# Patient Record
Sex: Female | Born: 1975 | Race: White | Hispanic: No | Smoking: Never smoker
Health system: Southern US, Community
[De-identification: ages and names within clinical notes are randomized; demographics above are authoritative.]

## PROBLEM LIST (undated history)

## (undated) DIAGNOSIS — F988 Other specified behavioral and emotional disorders with onset usually occurring in childhood and adolescence: Secondary | ICD-10-CM

## (undated) DIAGNOSIS — N83209 Unspecified ovarian cyst, unspecified side: Secondary | ICD-10-CM

## (undated) HISTORY — DX: Other specified behavioral and emotional disorders with onset usually occurring in childhood and adolescence: F98.8

## (undated) HISTORY — PX: ABDOMINAL HYSTERECTOMY: SHX81

## (undated) HISTORY — DX: Unspecified ovarian cyst, unspecified side: N83.209

## (undated) HISTORY — PX: PELVIC LAPAROSCOPY: SHX162

---

## 2009-05-16 ENCOUNTER — Emergency Department (HOSPITAL_COMMUNITY): Admission: EM | Admit: 2009-05-16 | Discharge: 2009-05-17 | Payer: Self-pay | Admitting: Emergency Medicine

## 2009-05-17 ENCOUNTER — Ambulatory Visit (HOSPITAL_COMMUNITY): Admission: RE | Admit: 2009-05-17 | Discharge: 2009-05-17 | Payer: Self-pay | Admitting: Emergency Medicine

## 2009-06-05 ENCOUNTER — Other Ambulatory Visit: Admission: RE | Admit: 2009-06-05 | Discharge: 2009-06-05 | Payer: Self-pay | Admitting: Obstetrics and Gynecology

## 2011-01-04 LAB — WET PREP, GENITAL: Yeast Wet Prep HPF POC: NONE SEEN

## 2011-01-04 LAB — CBC
HCT: 38.6 % (ref 36.0–46.0)
MCHC: 34.7 g/dL (ref 30.0–36.0)
Platelets: 303 10*3/uL (ref 150–400)
RDW: 13.6 % (ref 11.5–15.5)

## 2011-01-04 LAB — DIFFERENTIAL
Basophils Absolute: 0.1 10*3/uL (ref 0.0–0.1)
Basophils Relative: 1 % (ref 0–1)
Eosinophils Relative: 4 % (ref 0–5)
Lymphocytes Relative: 35 % (ref 12–46)
Monocytes Absolute: 0.7 10*3/uL (ref 0.1–1.0)
Neutro Abs: 6.1 10*3/uL (ref 1.7–7.7)

## 2011-01-04 LAB — BASIC METABOLIC PANEL
BUN: 11 mg/dL (ref 6–23)
CO2: 27 mEq/L (ref 19–32)
Calcium: 9.2 mg/dL (ref 8.4–10.5)
GFR calc non Af Amer: >60 mL/min (ref >60)
Glucose, Bld: 102 mg/dL — ABNORMAL HIGH (ref 70–99)

## 2011-01-04 LAB — URINALYSIS, ROUTINE W REFLEX MICROSCOPIC
Bilirubin Urine: NEGATIVE
Hgb urine dipstick: NEGATIVE
Ketones, ur: NEGATIVE mg/dL
Nitrite: NEGATIVE
Urobilinogen, UA: 0.2 mg/dL (ref 0.0–1.0)

## 2011-01-04 LAB — PREGNANCY, URINE: Preg Test, Ur: NEGATIVE

## 2011-01-29 ENCOUNTER — Emergency Department (HOSPITAL_COMMUNITY)
Admission: EM | Admit: 2011-01-29 | Discharge: 2011-01-29 | Disposition: A | Discharge status: Home / Self Care | Payer: Self-pay | Attending: Emergency Medicine | Admitting: Emergency Medicine

## 2011-01-29 ENCOUNTER — Emergency Department (HOSPITAL_COMMUNITY): Payer: Self-pay

## 2011-01-29 DIAGNOSIS — R1011 Right upper quadrant pain: Secondary | ICD-10-CM | POA: Insufficient documentation

## 2011-01-29 LAB — HEPATIC FUNCTION PANEL
Albumin: 4.2 g/dL (ref 3.5–5.2)
Alkaline Phosphatase: 34 U/L — ABNORMAL LOW (ref 39–117)
Bilirubin, Direct: 0.1 mg/dL (ref 0.0–0.3)
Total Bilirubin: 0.4 mg/dL (ref 0.3–1.2)

## 2011-01-29 LAB — CBC
HCT: 37.8 % (ref 36.0–46.0)
Hemoglobin: 13 g/dL (ref 12.0–15.0)
MCH: 30 pg (ref 26.0–34.0)
MCHC: 34.4 g/dL (ref 30.0–36.0)
MCV: 87.3 fL (ref 78.0–100.0)
RBC: 4.33 MIL/uL (ref 3.87–5.11)

## 2011-01-29 LAB — BASIC METABOLIC PANEL
BUN: 10 mg/dL (ref 6–23)
CO2: 25 mEq/L (ref 19–32)
Calcium: 9.3 mg/dL (ref 8.4–10.5)
Chloride: 100 mEq/L (ref 96–112)
Creatinine, Ser: 0.76 mg/dL (ref 0.4–1.2)
GFR calc Af Amer: >60 mL/min (ref >60)
Glucose, Bld: 97 mg/dL (ref 70–99)

## 2011-01-29 LAB — DIFFERENTIAL
Basophils Relative: 1 % (ref 0–1)
Lymphs Abs: 3 10*3/uL (ref 0.7–4.0)
Monocytes Absolute: 0.5 10*3/uL (ref 0.1–1.0)
Monocytes Relative: 6 % (ref 3–12)
Neutro Abs: 4.7 10*3/uL (ref 1.7–7.7)
Neutrophils Relative %: 55 % (ref 43–77)

## 2011-01-29 LAB — LIPASE, BLOOD: Lipase: 18 U/L (ref 11–59)

## 2011-01-29 MED ORDER — IOHEXOL 300 MG/ML  SOLN
100.0000 mL | Freq: Once | INTRAMUSCULAR | Status: AC | PRN
  Administered 2011-01-29: 100 mL via INTRAVENOUS

## 2011-01-30 ENCOUNTER — Ambulatory Visit (HOSPITAL_COMMUNITY)
Admit: 2011-01-30 | Discharge: 2011-01-30 | Disposition: A | Discharge status: Home / Self Care | Payer: Self-pay | Source: Ambulatory Visit | Attending: Emergency Medicine | Admitting: Emergency Medicine

## 2011-01-30 DIAGNOSIS — R1011 Right upper quadrant pain: Secondary | ICD-10-CM | POA: Insufficient documentation

## 2011-09-08 ENCOUNTER — Encounter: Payer: Self-pay | Admitting: Emergency Medicine

## 2011-09-08 ENCOUNTER — Emergency Department (INDEPENDENT_AMBULATORY_CARE_PROVIDER_SITE_OTHER): Payer: 59

## 2011-09-08 ENCOUNTER — Emergency Department (HOSPITAL_COMMUNITY)
Admission: EM | Admit: 2011-09-08 | Discharge: 2011-09-08 | Disposition: A | Discharge status: Home / Self Care | Payer: 59 | Source: Home / Self Care | Attending: Emergency Medicine | Admitting: Emergency Medicine

## 2011-09-08 DIAGNOSIS — J069 Acute upper respiratory infection, unspecified: Secondary | ICD-10-CM

## 2011-09-08 DIAGNOSIS — J029 Acute pharyngitis, unspecified: Secondary | ICD-10-CM

## 2011-09-08 DIAGNOSIS — J40 Bronchitis, not specified as acute or chronic: Secondary | ICD-10-CM

## 2011-09-08 MED ORDER — KETOROLAC TROMETHAMINE 60 MG/2ML IM SOLN
60.0000 mg | Freq: Once | INTRAMUSCULAR | Status: AC
  Administered 2011-09-08: 60 mg via INTRAMUSCULAR

## 2011-09-08 MED ORDER — KETOROLAC TROMETHAMINE 60 MG/2ML IM SOLN
INTRAMUSCULAR | Status: AC
  Filled 2011-09-08: qty 2

## 2011-09-08 MED ORDER — TRAMADOL HCL 50 MG PO TABS: 100.0000 mg | ORAL_TABLET | Freq: Three times a day (TID) | ORAL | Status: AC | PRN

## 2011-09-08 MED ORDER — PREDNISONE 10 MG PO TABS: ORAL_TABLET | ORAL | Status: DC

## 2011-09-08 MED ORDER — HYDROCOD POLST-CHLORPHEN POLST 10-8 MG/5ML PO LQCR: 5.0000 mL | Freq: Two times a day (BID) | ORAL | Status: DC | PRN

## 2011-09-08 MED ORDER — AMOXICILLIN-POT CLAVULANATE 875-125 MG PO TABS: 1.0000 | ORAL_TABLET | Freq: Two times a day (BID) | ORAL | Status: AC

## 2011-09-08 NOTE — ED Provider Notes (Signed)
History     CSN: [id number] Arrival date & time: 09/08/2011  6:43 PM   First MD Initiated Contact with Patient 09/08/11 1659      Chief Complaint  Patient presents with  . URI  . Fever    (Consider location/radiation/quality/duration/timing/severity/associated sxs/prior treatment) HPI Comments: Courtney Parsons has had a three-day history of cough productive of small amounts of yellow sputum with blood, wheezing, swollen, tender glands, severe sore throat, temperature 100.5, chills, sweats, earache, headache, and nausea. She has been exposed to a coworker who had a similar illness. She's not been able to eat or drink much and notes a diminished urine output. She has had her flu vaccine this year.  Patient is a 35 y.o. female presenting with URI and fever.  URI The primary symptoms include fever, fatigue, headaches, ear pain, sore throat, cough and nausea. Primary symptoms do not include wheezing, abdominal pain, vomiting or rash.  The headache is not associated with eye pain or neck stiffness.  The sore throat is accompanied by trouble swallowing.  The illness is not associated with chills, congestion or rhinorrhea.  Fever Primary symptoms of the febrile illness include fever, fatigue, headaches, cough and nausea. Primary symptoms do not include wheezing, shortness of breath, abdominal pain, vomiting, diarrhea or rash.  The headache is not associated with eye pain or neck stiffness.    History reviewed. No pertinent past medical history.  Past Surgical History  Procedure Date  . Abdominal hysterectomy     History reviewed. No pertinent family history.  History  Substance Use Topics  . Smoking status: Never Smoker   . Smokeless tobacco: Not on file  . Alcohol Use: No    OB History    Grav Para Term Preterm Abortions TAB SAB Ect Mult Living                  Review of Systems  Constitutional: Positive for fever and fatigue. Negative for chills.  HENT: Positive for ear pain,  sore throat and trouble swallowing. Negative for congestion, rhinorrhea, sneezing, neck stiffness, voice change and postnasal drip.   Eyes: Negative for pain, discharge and redness.  Respiratory: Positive for cough. Negative for chest tightness, shortness of breath and wheezing.   Gastrointestinal: Positive for nausea. Negative for vomiting, abdominal pain and diarrhea.  Skin: Negative for rash.  Neurological: Positive for headaches.    Allergies  Keflex  Home Medications   Current Outpatient Rx  Name Route Sig Dispense Refill  . CEFIXIME 400 MG PO TABS Oral Take 400 mg by mouth daily.      . AMOXICILLIN-POT CLAVULANATE 875-125 MG PO TABS Oral Take 1 tablet by mouth 2 (two) times daily. 20 tablet 0  . HYDROCOD POLST-CHLORPHEN POLST 10-8 MG/5ML PO LQCR Oral Take 5 mLs by mouth every 12 (twelve) hours as needed. 140 mL 0  . PREDNISONE 10 MG PO TABS  Take 4 tabs daily for 4 days, 3 tabs daily for 4 days, 2 tabs daily for 4 days, then 1 tab daily for 4 days.  Take all tabs at one time with food and preferably in the morning except for the first dose. 40 tablet 0  . TRAMADOL HCL 50 MG PO TABS Oral Take 2 tablets (100 mg total) by mouth every 8 (eight) hours as needed for pain. Maximum dose= 8 tablets per day 30 tablet 0    BP 122/74  Pulse 60  Temp(Src) 98.4 F (36.9 C) (Oral)  Resp 16  SpO2 99%  Physical Exam  Nursing note and vitals reviewed. Constitutional: She appears well-developed and well-nourished. No distress.  HENT:  Head: Normocephalic and atraumatic.  Right Ear: External ear normal.  Left Ear: External ear normal.  Nose: Nose normal.  Mouth/Throat: Oropharynx is clear and moist. No oropharyngeal exudate (her oropharynx is only minimally erythematous with no drainage, discharge, or ulcerations.).  Eyes: Conjunctivae and EOM are normal. Pupils are equal, round, and reactive to light. Right eye exhibits no discharge. Left eye exhibits no discharge.  Neck: Normal range of  motion. Neck supple.  Cardiovascular: Normal rate, regular rhythm and normal heart sounds.   Pulmonary/Chest: Effort normal and breath sounds normal. No stridor. No respiratory distress. She has no wheezes. She has no rales. She exhibits no tenderness.  Lymphadenopathy:    She has no cervical adenopathy.  Skin: Skin is warm and dry. No rash noted. She is not diaphoretic.    ED Course  Procedures (including critical care time)  Labs Reviewed - No data to display Dg Chest 2 View  09/08/2011  *RADIOLOGY REPORT*  Clinical Data: Cough and fever.  CHEST - 2 VIEW  Comparison: None.  Findings: No infiltrate, congestive heart failure or pneumothorax.  Heart size within normal limits.  Mild scoliosis.  IMPRESSION: No infiltrate.  Original Report Authenticated By: Fuller Canada, M.D.     1. Viral upper respiratory illness   2. Pharyngitis   3. Bronchitis       MDM  She has a severe sore throat, but nothing to suggest strep. She has been taking Suprax for 3 days, so a throat culture rapid strep would be useless at this point. The fact has not responded to antibiotics suggests a viral etiology, nevertheless she does appear to have bronchitis as well so we'll treat that with Augmentin and give her something for pain and a prednisone taper.        Roque Lias, MD 09/08/11 [phone]

## 2011-09-08 NOTE — ED Notes (Signed)
Pt here with sx fever x 3dys relieved by tylenol then elevates.last temp last night 100.5.sx dry cough,sore throat,diff swallowing and sore neck and wheezing.pt used advair 250/50 at home and suprax that she had at home and tylenol but no relief.afebrile on admit.

## 2012-10-22 ENCOUNTER — Encounter (HOSPITAL_COMMUNITY): Payer: Self-pay | Admitting: *Deleted

## 2012-10-22 ENCOUNTER — Emergency Department (HOSPITAL_COMMUNITY): Payer: 59

## 2012-10-22 ENCOUNTER — Emergency Department (HOSPITAL_COMMUNITY)
Admission: EM | Admit: 2012-10-22 | Discharge: 2012-10-22 | Disposition: A | Discharge status: Home / Self Care | Payer: 59 | Attending: Emergency Medicine | Admitting: Emergency Medicine

## 2012-10-22 DIAGNOSIS — R1110 Vomiting, unspecified: Secondary | ICD-10-CM

## 2012-10-22 DIAGNOSIS — Z90710 Acquired absence of both cervix and uterus: Secondary | ICD-10-CM | POA: Insufficient documentation

## 2012-10-22 DIAGNOSIS — R109 Unspecified abdominal pain: Secondary | ICD-10-CM | POA: Insufficient documentation

## 2012-10-22 DIAGNOSIS — R112 Nausea with vomiting, unspecified: Secondary | ICD-10-CM | POA: Insufficient documentation

## 2012-10-22 LAB — LIPASE, BLOOD: Lipase: 15 U/L (ref 11–59)

## 2012-10-22 LAB — COMPREHENSIVE METABOLIC PANEL
ALT: 13 U/L (ref 0–35)
Albumin: 3.5 g/dL (ref 3.5–5.2)
Alkaline Phosphatase: 40 U/L (ref 39–117)
BUN: 7 mg/dL (ref 6–23)
Chloride: 104 mEq/L (ref 96–112)
GFR calc Af Amer: >90 mL/min (ref >90)
Glucose, Bld: 88 mg/dL (ref 70–99)
Potassium: 4 mEq/L (ref 3.5–5.1)
Sodium: 139 mEq/L (ref 135–145)
Total Bilirubin: 0.1 mg/dL — ABNORMAL LOW (ref 0.3–1.2)

## 2012-10-22 MED ORDER — ONDANSETRON HCL 4 MG/2ML IJ SOLN
4.0000 mg | Freq: Once | INTRAMUSCULAR | Status: AC
  Administered 2012-10-22: 4 mg via INTRAVENOUS
  Filled 2012-10-22: qty 2

## 2012-10-22 MED ORDER — OXYCODONE-ACETAMINOPHEN 5-325 MG PO TABS: 1.0000 | ORAL_TABLET | Freq: Four times a day (QID) | ORAL | Status: DC | PRN

## 2012-10-22 MED ORDER — FENTANYL CITRATE 0.05 MG/ML IJ SOLN
50.0000 ug | Freq: Once | INTRAMUSCULAR | Status: AC
  Administered 2012-10-22: 50 ug via INTRAVENOUS
  Filled 2012-10-22: qty 2

## 2012-10-22 MED ORDER — SODIUM CHLORIDE 0.9 % IV BOLUS (SEPSIS)
1000.0000 mL | Freq: Once | INTRAVENOUS | Status: AC
  Administered 2012-10-22: 1000 mL via INTRAVENOUS

## 2012-10-22 MED ORDER — ONDANSETRON HCL 4 MG PO TABS: 4.0000 mg | ORAL_TABLET | Freq: Three times a day (TID) | ORAL | Status: DC | PRN

## 2012-10-22 NOTE — ED Provider Notes (Signed)
History     CSN: [id number]  Arrival date & time 10/22/12  1907   First MD Initiated Contact with Patient 10/22/12 1954      Chief Complaint  Patient presents with  . Emesis    (Consider location/radiation/quality/duration/timing/severity/associated sxs/prior treatment) HPI Pt reports she has had persistent vomiting and abdominal bloating for the last 4-5 days. No BM during that time except small amount today. She has vomited everything she's tried to eat or drink including Sprite just prior to arrival. She reports intermittent diffuse cramping abdominal pain, occasionally worse in the RUQ. She was seen at PCP office today in [place] where she had normal UA and CBC. Sent to the ED for further eval.    History reviewed. No pertinent past medical history.  Past Surgical History  Procedure Date  . Abdominal hysterectomy   . Cesarean section     History reviewed. No pertinent family history.  History  Substance Use Topics  . Smoking status: Never Smoker   . Smokeless tobacco: Not on file  . Alcohol Use: Yes     Comment: rarely    OB History    Grav Para Term Preterm Abortions TAB SAB Ect Mult Living                  Review of Systems All other systems reviewed and are negative except as noted in HPI.   Allergies  Dilaudid; Epinephrine; and Cephalexin  Home Medications   Current Outpatient Rx  Name  Route  Sig  Dispense  Refill  . ACETAMINOPHEN 500 MG PO TABS   Oral   Take 500 mg by mouth every 6 (six) hours as needed. For pain         . IBUPROFEN 200 MG PO TABS   Oral   Take 200 mg by mouth every 6 (six) hours as needed. For pain           BP 111/74  Pulse 70  Temp 97.9 F (36.6 C) (Oral)  Resp 20  Ht 5\' 2"  (1.575 m)  Wt 148 lb (67.132 kg)  BMI 27.07 kg/m2  SpO2 100%  Physical Exam  Nursing note and vitals reviewed. Constitutional: She is oriented to person, place, and time. She appears well-developed and well-nourished.  HENT:  Head:  Normocephalic and atraumatic.  Eyes: EOM are normal. Pupils are equal, round, and reactive to light.  Neck: Normal range of motion. Neck supple.  Cardiovascular: Normal rate, normal heart sounds and intact distal pulses.   Pulmonary/Chest: Effort normal and breath sounds normal.  Abdominal: Bowel sounds are normal. She exhibits no distension. There is tenderness (diffuse, moderate). There is guarding. There is no rebound.  Musculoskeletal: Normal range of motion. She exhibits no edema and no tenderness.  Neurological: She is alert and oriented to person, place, and time. She has normal strength. No cranial nerve deficit or sensory deficit.  Skin: Skin is warm and dry. No rash noted.  Psychiatric: She has a normal mood and affect.    ED Course  Procedures (including critical care time)  Labs Reviewed  COMPREHENSIVE METABOLIC PANEL - Abnormal; Notable for the following:    Total Bilirubin 0.1 (*)     All other components within normal limits  LIPASE, BLOOD   Dg Abd Acute W/chest  10/22/2012  *RADIOLOGY REPORT*  Clinical Data: Abdominal pain, vomiting  ACUTE ABDOMEN SERIES (ABDOMEN 2 VIEW & CHEST 1 VIEW)  Comparison: 09/08/2011  Findings: Cardiomediastinal silhouette is stable.  No acute infiltrate  or pulmonary edema.  There is nonspecific nonobstructive bowel gas pattern.  Stool and gas noted in proximal colon.  Gas noted in the distal colon.  No free abdominal air.  IMPRESSION: No acute disease.  Nonspecific nonobstructive bowel gas pattern. Stool and gas noted in proximal colon.  Gas noted in distal colon without significant colonic distention.   Original Report Authenticated By: Natasha Mead, M.D.      No diagnosis found.    MDM  Labs unremarkable. Pt feeling better. Will attempt PO trial.   9:27 PM Pt able to tolerate PO, but having some cramping pain and nausea. Will have patient return in the AM for [place] to rule out gallstones. Normal labs today, so doubt cholecystitis or obstructing  stone.       Jerry Haugen B. Bernette Mayers, MD 10/22/12 2128

## 2012-10-22 NOTE — ED Notes (Signed)
Nausea, vomiting, feels distended, Last bm  Was Monday.  Sent from [facility]

## 2012-10-23 ENCOUNTER — Ambulatory Visit (HOSPITAL_COMMUNITY)
Admit: 2012-10-23 | Discharge: 2012-10-23 | Disposition: A | Discharge status: Home / Self Care | Payer: 59 | Source: Ambulatory Visit | Attending: Emergency Medicine | Admitting: Emergency Medicine

## 2012-10-23 DIAGNOSIS — R109 Unspecified abdominal pain: Secondary | ICD-10-CM | POA: Insufficient documentation

## 2012-10-23 DIAGNOSIS — R112 Nausea with vomiting, unspecified: Secondary | ICD-10-CM | POA: Insufficient documentation

## 2013-06-21 ENCOUNTER — Ambulatory Visit (INDEPENDENT_AMBULATORY_CARE_PROVIDER_SITE_OTHER): Payer: 59 | Admitting: Psychology

## 2013-06-21 ENCOUNTER — Encounter (HOSPITAL_COMMUNITY): Payer: Self-pay | Admitting: Psychology

## 2013-06-21 DIAGNOSIS — F988 Other specified behavioral and emotional disorders with onset usually occurring in childhood and adolescence: Secondary | ICD-10-CM

## 2013-06-21 NOTE — Progress Notes (Signed)
Patient:   Courtney Parsons   DOB:   January 16, 1976  MR Number:  [id number]  Location:  [facility] [address] [place] [address] Dept: [phone]           Date of Service:   06/21/2013  Start Time:    3 PM End Time:    4 PM  Provider/Observer:  Hershal Coria PSYD       Billing Code/Service: [phone]  Chief Complaint:     Chief Complaint  Patient presents with  . ADD    Reason for Service:   the patient was self referred because of difficulty with concentration and attention. The patient reports he has difficulty maintaining focus and poor memory abilities. The patient reports that these attention and concentration issues have been present for as far as she can remember but they do feel like they're getting worse over time. She reports that these have been a major issue on her job situation in a relationship issues. The patient reports that she is planning on continuing her education and working on her Masters in social work and is concerned that these issues will have a deleterious effect on these attempts.   Current Status:  The patient describes significant problems with attention concentration, racing thoughts, memory problems, and excessive worrying.  Reliability of Information: The information is provided by the patient as well as a review of available medical records.  Behavioral Observation: Courtney Parsons  presents as a 37 y.o.-year-old Right Caucasian Female who appeared her stated age. her dress was Appropriate and she was Well Groomed and her manners were Appropriate to the situation.  There were not any physical disabilities noted.  she displayed an appropriate level of cooperation and motivation.    Interactions:    Active   Attention:   within normal limits  Memory:   within normal limits  Visuo-spatial:   within normal limits  Speech  (Volume):  normal  Speech:   normal pitch and normal volume  Thought Process:  Coherent  Though Content:  WNL  Orientation:   person, place, time/date and situation  Judgment:   Good  Planning:   Good  Affect:    Anxious  Mood:    Anxious  Insight:   Good  Intelligence:   high  Marital Status/Living:  the patient was born and raised in [place] and has a 42 year old brother. Her parents were never married and her father ended up dying from a drug overdose. The patient is divorced and has an 58 year old daughter. The patient reports that she spends her leisure time working out at the gym for 5 days a week. She lives by herself. She does have a boyfriend.   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   Past Employment:    Substance Use:  No concerns of substance abuse are reported.    Education:   College the patient received her GED and return to school to get her bachelors degree in human services. The patient does acknowledge a hard time concentrating and maintaining focus throughout school. The patient is planning on returning to school to get her Masters in social work.    Medical History:  History reviewed. No pertinent past medical history.      Outpatient Encounter Prescriptions as of 06/21/2013  Medication Sig Dispense  Refill  . acetaminophen (TYLENOL) 500 MG tablet Take 500 mg by mouth every 6 (six) hours as needed. For pain      . ibuprofen (ADVIL,MOTRIN) 200 MG tablet Take 200 mg by mouth every 6 (six) hours as needed. For pain      . ondansetron (ZOFRAN) 4 MG tablet Take 1 tablet (4 mg total) by mouth every 8 (eight) hours as needed for nausea.  12 tablet  0  . oxyCODONE-acetaminophen (PERCOCET/ROXICET) 5-325 MG per tablet Take 1-2 tablets by mouth every 6 (six) hours as needed for pain.  20 tablet  0   No facility-administered encounter medications on  file as of 06/21/2013.          Sexual History:   History  Sexual Activity  . Sexual Activity: Yes  . Birth Control/ Protection: Surgical    Abuse/Trauma History:  the patient denies any history of abuse or trauma.  Psychiatric History:   other than the difficulties that she is had with regard to attention and concentration the patient has no other psychiatric history.   Family Med/Psych History: No family history on file.  Risk of Suicide/Violence: virtually non-existent   Impression/DX:   at this point, the patient does describe some consistent and persistent cough with attention/concentration problem for all of her life. 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 There are some indications of some anxiety and the basic differential we are looking at his attention deficit disorder versus underlying anxiety condition.   Disposition/Plan:   we will set the patient up for formal and objective assessment of attention/concentration abilities utilizing the comprehensive attention battery.   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

## 2013-07-07 ENCOUNTER — Encounter (HOSPITAL_COMMUNITY): Payer: Self-pay | Admitting: Psychology

## 2013-07-07 ENCOUNTER — Ambulatory Visit (INDEPENDENT_AMBULATORY_CARE_PROVIDER_SITE_OTHER): Payer: 59 | Admitting: Psychology

## 2013-07-07 DIAGNOSIS — F988 Other specified behavioral and emotional disorders with onset usually occurring in childhood and adolescence: Secondary | ICD-10-CM

## 2013-07-07 DIAGNOSIS — F411 Generalized anxiety disorder: Secondary | ICD-10-CM

## 2013-07-07 NOTE — Progress Notes (Signed)
The patient was administered the Comprehensive Attention Battery and the CAB CPT measures. The patient appeared to fully participate in these testing procedures and this does appear to be a fair and valid sample of her current attentional abilities as well as various aspects of executive functioning. Below are the results of this broad and comprehensive assessment of attention/concentration and executive functioning.  Initially, the patient was administered the auditory/visual reaction time test. These two measures are both pure reaction time measures and are administered in both the visual and auditory modalities. On the visual pure reaction time test, the patient accurately responded to 41 of the 50 targets, which is an impaired score but I do think some of this initial difficulty had more to do with the way her responding style interacting with the touch green interface and not with actual excessive omissions. her average response time was 345 ms which is within normal limits. The patient was administered the auditory pure reaction time test and she correctly responded to 46 of 50 targets, which is an efficient performance and within normal limits. her average response time was 334 ms, which within normal limits.  The patient was then administered the discriminant reaction time test. she was administered the visual, auditory, and mixed subtests. On the visual discriminate reaction time measure, she correctly responded to 31 of 35 targets and had 0 errors of commission and 4 errors of omission. This is an efficient performance and represents a performance that is within normative expectations. her average response time for correctly responded to items was 424 ms which is also within normal limits. The patient was then administered the auditory discriminate reaction time measure. she correctly responded to 34 of 35 targets, which is a good performance and within normal limits. her average response time was 678  ms, which is also within normative expectations. The patient was then administered the mixed discriminate reaction time, which require shifting from between either auditory or visual targets with an alteration between auditory and visual stimuli. This measure require shifting attention on top of discriminate identification and responding.  The patient correctly responded to 28 of the 30 targets and had 2 errors of commission and 2 errors of omission. This is a very good score for accuracy.  her average response time for correct responses was 743 ms.  This performance is within  normal limits and represents excellent performance.  The patient was administered the auditory/visual scan reaction time test. On the visual measure the patient correctly responded to 37 of 40 targets and the average response time was within normal limits. The auditory measure resulted in the correct response to 39 of 40 targets with 1 errors of commission and 1 error of omission. her average response times was within normal limits. The patient was then administered the mixed auditory visual scan measure and she correctly responded to 40 of 40 targets, which is within normal limits and her response times were within normal limits.  The patient was then administered the auditory/visual encoding test. On the auditory forwards the patient's performance was within normal limits.  On the auditory backwards measures the patient's performance was within normal limits.  This pattern suggests that not only did she will normative expectations for her age matched comparison group she has exceeded does and was nearly a standard deviation above for years for both auditory encoding forward and auditory encoding backwards. On the visual encoding forward measure the patient produced performance that was above normal limits.  On the visual backwards  measures the patient's performance was above normal limits.  Overall, this pattern suggests that auditory  encoding as well as visual encoding are all equal to or above normative expectations. She approached 2 standard deviations above normative expectations for visual encoding backwards. Again, the patient did excellent on encoding measures.  The patient was then administered the Stroop interference cancellation test. This task is broken down into eight separate trials. On the first four trials the patient is presented with a focus execute task that requires the patient to scan a 36 grid layout in which the words red green or blue were randomly printed in each grid. Each of these color words and be printed in either red green or blue color. On half of them, the word matches the color of the font and it is these that the patient is to identify where the color and word match. After the first four trials of this visual scanning measure change to four trials that include a Stroop interference component inwhich the words red green and blue are played randomly over the speakers. On the first four "noninterference" trials the patient produced performances on these focus execute task that were  equal to or above normal limits. she correctly identified between  12 and  18 items on each of these trials. On the next four interference trials, the patient's performance showed  continued excellent performance in no indication of any interference effects. The patient had  No significant interference and  no difficulty handling the Stroop interference measures. her performance under these interference measures suggest  excellent focus execute abilities.  The patient was then administered the CAB CPT visual monitor measure, which is a 15 minute long visual continuous performance measure.  This measure is broken down into five 3-minute blocks of time for analysis. The patient is presented with either the color red green or blue every 2 seconds and every time the color red is presented the patient is to respond. On the first 3 min.  Block of time the patient correctly identified  29 of 30 targets with  0 error of commission and  1 errors of omission. her average response time was  383 ms. This performance  consistently and progressively deteriorated over the next four blocks of time.  Average response time  slowed consistent and by the last 3 min. of this measure average response time was  655  ms, which is  a significant increase over the very first 3 min. of this task of approaching 200 ms. The results of this continues performance measure are  clearly consistent with any deficits with regard to sustained attention and concentration.  Overall:  The patient's performance on this broad range of attention/concentration measures and executive functioning measures are are consistent with patterns found with attention deficit disorder and the consistent with attentional deficits associated with anxiety. Across the board almost all of the measures of attention concentration including pure reaction time measures discriminate reaction time measures, cognitive shifting measures, both auditory and visual encoding measures, as well as measures designed to assess information processing speed and the ability to remain free from target distraction the patient did equal to or above normative expectations. There were no deficits down for focus execute abilities, encoding abilities, or shifting abilities regard to attention and concentration and either auditory or visual sensory modalities. However, this didn't start contrast to the final measure objectively assessing for sustained attention. While the patient started out quite efficient performance of the first 3 minutes of this  measure her performance consistently and progressively declined as a function of time. For example on the first 3 minutes she had one error of omission and by the block of time between 9 and 12 minutes she had a errors of omission the pattern showed a progressive and steady  deterioration rather than isolated deterioration suggesting also set or momentary loss of function. The patient showed progressive and steady slowing in her response times with a short episode of recovery towards the very end of the measure where she was likely realizing her loss of focus and reviewed and tried to "pull it together" active his realization.  Patient does have degree of anxiety and worry he does not appear that there is a significant underlying anxiety disorder. Perform his anxiety and concerns about her on self-awareness of these attentional problems are probably the biggest role with regard to her apprehension. The patient shows very clear in specific patterns consistent with individuals diagnosed with adult residual attention deficit disorder and often been shown to be good responders to psychostimulant medications. If it wasn't for some of the mild anxiety symptoms this would clearly be the specific recommendation as far as interventions. While I do think that this is likely to be the most efficacious strategy for improving the patient's performance I will encourage her to keep an eye on any worsening of underlying anxiety or other changes just to make sure we are not missing something with regard to anxiety that could potentially be exacerbated by psychostimulant medications.

## 2013-07-13 ENCOUNTER — Ambulatory Visit (INDEPENDENT_AMBULATORY_CARE_PROVIDER_SITE_OTHER): Payer: 59 | Admitting: Psychology

## 2013-07-13 DIAGNOSIS — F988 Other specified behavioral and emotional disorders with onset usually occurring in childhood and adolescence: Secondary | ICD-10-CM

## 2013-08-04 ENCOUNTER — Other Ambulatory Visit: Payer: Self-pay

## 2013-08-23 ENCOUNTER — Encounter (HOSPITAL_COMMUNITY): Payer: Self-pay | Admitting: Psychology

## 2013-08-23 NOTE — Progress Notes (Signed)
Today I provided feedback regarding the results of the recent neuropsychological testing to assess objective factors and features of attentional functioning. The purpose was to do an evaluation and rule out versus ruling out of attention deficit disorder and other possible explanations of her complaints. Below is a summary of the recent evaluation.  Overall:  The patient's performance on this broad range of attention/concentration measures and executive functioning measures are are consistent with patterns found with attention deficit disorder and the consistent with attentional deficits associated with anxiety. Across the board almost all of the measures of attention concentration including pure reaction time measures discriminate reaction time measures, cognitive shifting measures, both auditory and visual encoding measures, as well as measures designed to assess information processing speed and the ability to remain free from target distraction the patient did equal to or above normative expectations. There were no deficits down for focus execute abilities, encoding abilities, or shifting abilities regard to attention and concentration and either auditory or visual sensory modalities. However, this didn't start contrast to the final measure objectively assessing for sustained attention. While the patient started out quite efficient performance of the first 3 minutes of this measure her performance consistently and progressively declined as a function of time. For example on the first 3 minutes she had one error of omission and by the block of time between 9 and 12 minutes she had a errors of omission the pattern showed a progressive and steady deterioration rather than isolated deterioration suggesting also set or momentary loss of function. The patient showed progressive and steady slowing in her response times with a short episode of recovery towards the very end of the measure where she was likely realizing  her loss of focus and reviewed and tried to "pull it together" active his realization.  Patient does have degree of anxiety and worry he does not appear that there is a significant underlying anxiety disorder. Perform his anxiety and concerns about her on self-awareness of these attentional problems are probably the biggest role with regard to her apprehension. The patient shows very clear in specific patterns consistent with individuals diagnosed with adult residual attention deficit disorder and often been shown to be good responders to psychostimulant medications. If it wasn't for some of the mild anxiety symptoms this would clearly be the specific recommendation as far as interventions. While I do think that this is likely to be the most efficacious strategy for improving the patient's performance I will encourage her to keep an eye on any worsening of underlying anxiety or other changes just to make sure we are not missing something with regard to anxiety that could potentially be exacerbated by psychostimulant medications.

## 2013-10-19 ENCOUNTER — Encounter: Payer: Self-pay | Admitting: Women's Health

## 2013-10-19 ENCOUNTER — Ambulatory Visit (INDEPENDENT_AMBULATORY_CARE_PROVIDER_SITE_OTHER): Payer: 59 | Admitting: Women's Health

## 2013-10-19 VITALS — BP 124/80 | Ht 61.0 in | Wt 140.0 lb

## 2013-10-19 DIAGNOSIS — N949 Unspecified condition associated with female genital organs and menstrual cycle: Secondary | ICD-10-CM

## 2013-10-19 DIAGNOSIS — N898 Other specified noninflammatory disorders of vagina: Secondary | ICD-10-CM

## 2013-10-19 DIAGNOSIS — Z01419 Encounter for gynecological examination (general) (routine) without abnormal findings: Secondary | ICD-10-CM

## 2013-10-19 DIAGNOSIS — Z833 Family history of diabetes mellitus: Secondary | ICD-10-CM

## 2013-10-19 LAB — WET PREP FOR TRICH, YEAST, CLUE
CLUE CELLS WET PREP: NONE SEEN
TRICH WET PREP: NONE SEEN
WBC, Wet Prep HPF POC: NONE SEEN
YEAST WET PREP: NONE SEEN

## 2013-10-19 LAB — CBC WITH DIFFERENTIAL/PLATELET
BASOS ABS: 0 10*3/uL (ref 0.0–0.1)
Basophils Relative: 0 % (ref 0–1)
EOS ABS: 0.1 10*3/uL (ref 0.0–0.7)
EOS PCT: 1 % (ref 0–5)
HCT: 39.6 % (ref 36.0–46.0)
Hemoglobin: 13.2 g/dL (ref 12.0–15.0)
LYMPHS PCT: 36 % (ref 12–46)
Lymphs Abs: 2.7 10*3/uL (ref 0.7–4.0)
MCH: 29.7 pg (ref 26.0–34.0)
MCHC: 33.3 g/dL (ref 30.0–36.0)
MCV: 89 fL (ref 78.0–100.0)
Monocytes Absolute: 0.4 10*3/uL (ref 0.1–1.0)
Monocytes Relative: 6 % (ref 3–12)
Neutro Abs: 4.2 10*3/uL (ref 1.7–7.7)
Neutrophils Relative %: 57 % (ref 43–77)
PLATELETS: 310 10*3/uL (ref 150–400)
RBC: 4.45 MIL/uL (ref 3.87–5.11)
RDW: 13.4 % (ref 11.5–15.5)
WBC: 7.4 10*3/uL (ref 4.0–10.5)

## 2013-10-19 NOTE — Patient Instructions (Signed)

## 2013-10-19 NOTE — Progress Notes (Signed)
Henderson Cloudammy L Cappelletti [birth year] [id number]    History:    Presents for annual exam with complaint of chronic abdominal pain. TAH 2011 for abdominal pain without relief, [facility].  Same partner past three years/ reports dyspareunia with deep penetration. Normal pap history. Reports frequent yeast infections and vaginal odor since hysterectomy. History of ovarian cysts. 80 pound weight loss with [employer]  last year.  ADD, managed by primary care.   Past medical history, past surgical history, family history and social history were all reviewed and documented in the EPIC chart. 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  Katelyn, 18, nursing school.  Mother DM, HTN, heart disease, Father history unknown  ROS:  A  ROS was performed and pertinent positives and negatives are included.  Exam:  Filed Vitals:   10/19/13 1607  BP: 124/80    General appearance:  Normal Thyroid:  Symmetrical, normal in size, without palpable masses or nodularity. Respiratory  Auscultation:  Clear without wheezing or rhonchi Cardiovascular  Auscultation:  Regular rate, without rubs, murmurs or gallops  Edema/varicosities:  Not grossly evident Abdominal  Tender with some guarding below umbilical/, without masses, or rebound.  Liver/spleen:  No organomegaly noted  Hernia:  None appreciated  Skin  Inspection:  Grossly normal   Breasts: Examined lying and sitting.     Right: Without masses, retractions, discharge or axillary adenopathy.     Left: Without masses, retractions, discharge or axillary adenopathy. Gentitourinary   Inguinal/mons:  Normal without inguinal adenopathy  External genitalia:  Normal  BUS/Urethra/Skene's glands:  Normal  Vagina:  Normal, wet prep negative  Adnexa/parametria:     Rt: Without masses or tenderness.   Lt: Without masses or tenderness.  Anus and perineum: Normal  Digital rectal exam: Normal sphincter tone without palpated masses or  tenderness  Assessment/Plan:  38 y.o.  DWF G1P1 for annual exam with complaint of chronic abdominal pain.  TAH for chronic abdominal pain without relief  Chronic abdominal pain ADD, managed by primary care   Plan: CBC, glucose, UA.  SBE's, encouraged to schedule mammogram next year.  Continue exercise, healthy lifestyle (80 pound weight loss with [employer]), MVI encouraged.  Instructed to schedule GI consult for persistent abdominal pain. Reassured wet prep negative no visible infection. Encourage vaginal lubricants with intercourse.    Harrington ChallengerYOUNG,NANCY J [facility] CenterWHNP, 4:58 PM 10/19/2013

## 2013-10-20 LAB — URINALYSIS W MICROSCOPIC + REFLEX CULTURE
BACTERIA UA: NONE SEEN
BILIRUBIN URINE: NEGATIVE
Casts: NONE SEEN
Crystals: NONE SEEN
Glucose, UA: NEGATIVE mg/dL
Hgb urine dipstick: NEGATIVE
KETONES UR: NEGATIVE mg/dL
Leukocytes, UA: NEGATIVE
Nitrite: NEGATIVE
PROTEIN: NEGATIVE mg/dL
Specific Gravity, Urine: 1.008 (ref 1.005–1.030)
Squamous Epithelial / LPF: NONE SEEN
UROBILINOGEN UA: 0.2 mg/dL (ref 0.0–1.0)
pH: 6 (ref 5.0–8.0)

## 2013-10-20 LAB — GLUCOSE, RANDOM: Glucose, Bld: 88 mg/dL (ref 70–99)

## 2014-04-03 ENCOUNTER — Telehealth: Payer: Self-pay | Admitting: *Deleted

## 2014-04-03 MED ORDER — FLUCONAZOLE 150 MG PO TABS: 150.0000 mg | ORAL_TABLET | Freq: Once | ORAL | Status: DC

## 2014-04-03 NOTE — Telephone Encounter (Signed)
Pt informed, rx sent 

## 2014-04-03 NOTE — Telephone Encounter (Signed)
Ok, diflucan 150 one dose, with one refill, office visit if no relief.

## 2014-04-03 NOTE — Telephone Encounter (Signed)
Please see below note you are back up MD

## 2014-04-03 NOTE — Telephone Encounter (Signed)
Pt calling c/o yeast infection itching and white discharge. Pt requesting Rx, at work unable to make OV Please advise

## 2014-04-26 ENCOUNTER — Other Ambulatory Visit (HOSPITAL_COMMUNITY): Payer: Self-pay | Admitting: Family Medicine

## 2014-04-26 DIAGNOSIS — M5412 Radiculopathy, cervical region: Secondary | ICD-10-CM

## 2014-04-27 ENCOUNTER — Ambulatory Visit (HOSPITAL_COMMUNITY)
Admission: RE | Admit: 2014-04-27 | Discharge: 2014-04-27 | Disposition: A | Discharge status: Home / Self Care | Payer: 59 | Source: Ambulatory Visit | Attending: Family Medicine | Admitting: Family Medicine

## 2014-04-27 ENCOUNTER — Encounter (HOSPITAL_COMMUNITY): Payer: Self-pay

## 2014-04-27 DIAGNOSIS — M47812 Spondylosis without myelopathy or radiculopathy, cervical region: Secondary | ICD-10-CM | POA: Insufficient documentation

## 2014-04-27 DIAGNOSIS — M5412 Radiculopathy, cervical region: Secondary | ICD-10-CM

## 2014-04-27 DIAGNOSIS — M5020 Other cervical disc displacement, unspecified cervical region: Secondary | ICD-10-CM | POA: Insufficient documentation

## 2014-04-28 ENCOUNTER — Ambulatory Visit (HOSPITAL_COMMUNITY)
Admission: AD | Admit: 2014-04-28 | Discharge: 2014-04-29 | Disposition: A | Discharge status: Home / Self Care | Payer: 59 | Source: Ambulatory Visit | Attending: Neurosurgery | Admitting: Neurosurgery

## 2014-04-28 ENCOUNTER — Ambulatory Visit (HOSPITAL_COMMUNITY): Payer: 59

## 2014-04-28 ENCOUNTER — Encounter (HOSPITAL_COMMUNITY): Payer: 59 | Admitting: Anesthesiology

## 2014-04-28 ENCOUNTER — Encounter (HOSPITAL_COMMUNITY)
Admission: AD | Disposition: A | Discharge status: Home / Self Care | Payer: Self-pay | Source: Ambulatory Visit | Attending: Neurosurgery

## 2014-04-28 ENCOUNTER — Encounter (HOSPITAL_COMMUNITY): Payer: Self-pay | Admitting: Anesthesiology

## 2014-04-28 ENCOUNTER — Observation Stay (HOSPITAL_COMMUNITY): Payer: 59 | Admitting: Anesthesiology

## 2014-04-28 DIAGNOSIS — Z79899 Other long term (current) drug therapy: Secondary | ICD-10-CM | POA: Diagnosis not present

## 2014-04-28 DIAGNOSIS — M5020 Other cervical disc displacement, unspecified cervical region: Secondary | ICD-10-CM | POA: Diagnosis not present

## 2014-04-28 HISTORY — PX: ANTERIOR CERVICAL DECOMP/DISCECTOMY FUSION: SHX1161

## 2014-04-28 LAB — URINALYSIS, ROUTINE W REFLEX MICROSCOPIC
Bilirubin Urine: NEGATIVE
Glucose, UA: NEGATIVE mg/dL
Hgb urine dipstick: NEGATIVE
Ketones, ur: NEGATIVE mg/dL
Leukocytes, UA: NEGATIVE
Nitrite: NEGATIVE
PROTEIN: NEGATIVE mg/dL
Specific Gravity, Urine: 1.007 (ref 1.005–1.030)
UROBILINOGEN UA: 0.2 mg/dL (ref 0.0–1.0)
pH: 7 (ref 5.0–8.0)

## 2014-04-28 LAB — CBC WITH DIFFERENTIAL/PLATELET
BASOS PCT: 0 % (ref 0–1)
Basophils Absolute: 0 10*3/uL (ref 0.0–0.1)
EOS ABS: 0.1 10*3/uL (ref 0.0–0.7)
EOS PCT: 2 % (ref 0–5)
HEMATOCRIT: 38.3 % (ref 36.0–46.0)
HEMOGLOBIN: 12.9 g/dL (ref 12.0–15.0)
Lymphocytes Relative: 32 % (ref 12–46)
Lymphs Abs: 2 10*3/uL (ref 0.7–4.0)
MCH: 30 pg (ref 26.0–34.0)
MCHC: 33.7 g/dL (ref 30.0–36.0)
MCV: 89.1 fL (ref 78.0–100.0)
MONOS PCT: 6 % (ref 3–12)
Monocytes Absolute: 0.4 10*3/uL (ref 0.1–1.0)
Neutro Abs: 3.8 10*3/uL (ref 1.7–7.7)
Neutrophils Relative %: 60 % (ref 43–77)
Platelets: 266 10*3/uL (ref 150–400)
RBC: 4.3 MIL/uL (ref 3.87–5.11)
RDW: 12.7 % (ref 11.5–15.5)
WBC: 6.3 10*3/uL (ref 4.0–10.5)

## 2014-04-28 LAB — BASIC METABOLIC PANEL
Anion gap: 10 (ref 5–15)
BUN: 10 mg/dL (ref 6–23)
CHLORIDE: 105 meq/L (ref 96–112)
CO2: 23 meq/L (ref 19–32)
CREATININE: 0.84 mg/dL (ref 0.50–1.10)
Calcium: 9 mg/dL (ref 8.4–10.5)
GFR calc non Af Amer: 86 mL/min — ABNORMAL LOW (ref >90)
GLUCOSE: 86 mg/dL (ref 70–99)
Potassium: 4 mEq/L (ref 3.7–5.3)
Sodium: 138 mEq/L (ref 137–147)

## 2014-04-28 LAB — SURGICAL PCR SCREEN
MRSA, PCR: NEGATIVE
Staphylococcus aureus: NEGATIVE

## 2014-04-28 SURGERY — ANTERIOR CERVICAL DECOMPRESSION/DISCECTOMY FUSION 1 LEVEL
Anesthesia: General

## 2014-04-28 MED ORDER — MORPHINE SULFATE 2 MG/ML IJ SOLN
1.0000 mg | INTRAMUSCULAR | Status: DC | PRN
  Administered 2014-04-28 – 2014-04-29 (×4): 1 mg via INTRAVENOUS
  Filled 2014-04-28 (×4): qty 1

## 2014-04-28 MED ORDER — LIDOCAINE-EPINEPHRINE 0.5 %-1:200000 IJ SOLN
INTRAMUSCULAR | Status: DC | PRN
  Administered 2014-04-28: 1 mL

## 2014-04-28 MED ORDER — MENTHOL 3 MG MT LOZG
1.0000 | LOZENGE | OROMUCOSAL | Status: DC | PRN
  Administered 2014-04-29: 3 mg via ORAL
  Filled 2014-04-28: qty 9

## 2014-04-28 MED ORDER — SODIUM CHLORIDE 0.9 % IJ SOLN
3.0000 mL | Freq: Two times a day (BID) | INTRAMUSCULAR | Status: DC
  Administered 2014-04-28: 3 mL via INTRAVENOUS

## 2014-04-28 MED ORDER — PROPOFOL 10 MG/ML IV BOLUS
INTRAVENOUS | Status: AC
  Filled 2014-04-28: qty 20

## 2014-04-28 MED ORDER — HEMOSTATIC AGENTS (NO CHARGE) OPTIME
TOPICAL | Status: DC | PRN
  Administered 2014-04-28: 1 via TOPICAL

## 2014-04-28 MED ORDER — LACTATED RINGERS IV SOLN
INTRAVENOUS | Status: DC | PRN
  Administered 2014-04-28 (×2): via INTRAVENOUS

## 2014-04-28 MED ORDER — MORPHINE SULFATE 2 MG/ML IJ SOLN
INTRAMUSCULAR | Status: AC
  Filled 2014-04-28: qty 1

## 2014-04-28 MED ORDER — SENNA 8.6 MG PO TABS
1.0000 | ORAL_TABLET | Freq: Two times a day (BID) | ORAL | Status: DC
  Filled 2014-04-28 (×2): qty 1

## 2014-04-28 MED ORDER — MIDAZOLAM HCL 2 MG/2ML IJ SOLN
INTRAMUSCULAR | Status: AC
  Filled 2014-04-28: qty 2

## 2014-04-28 MED ORDER — ACETAMINOPHEN 325 MG PO TABS: 650.0000 mg | ORAL_TABLET | ORAL | Status: DC | PRN

## 2014-04-28 MED ORDER — POTASSIUM CHLORIDE IN NACL 20-0.9 MEQ/L-% IV SOLN
INTRAVENOUS | Status: DC
  Filled 2014-04-28 (×3): qty 1000

## 2014-04-28 MED ORDER — ONDANSETRON HCL 4 MG/2ML IJ SOLN
INTRAMUSCULAR | Status: DC | PRN
  Administered 2014-04-28: 4 mg via INTRAVENOUS

## 2014-04-28 MED ORDER — DEXAMETHASONE SODIUM PHOSPHATE 10 MG/ML IJ SOLN
INTRAMUSCULAR | Status: AC
Start: 2014-04-28 — End: 2014-04-29
  Filled 2014-04-28: qty 1

## 2014-04-28 MED ORDER — ONDANSETRON HCL 4 MG/2ML IJ SOLN: 4.0000 mg | INTRAMUSCULAR | Status: DC | PRN

## 2014-04-28 MED ORDER — ACETAMINOPHEN 650 MG RE SUPP: 650.0000 mg | RECTAL | Status: DC | PRN

## 2014-04-28 MED ORDER — ROCURONIUM BROMIDE 50 MG/5ML IV SOLN
INTRAVENOUS | Status: AC
  Filled 2014-04-28: qty 1

## 2014-04-28 MED ORDER — SODIUM CHLORIDE 0.9 % IV SOLN: 250.0000 mL | INTRAVENOUS | Status: DC

## 2014-04-28 MED ORDER — LIDOCAINE HCL (CARDIAC) 20 MG/ML IV SOLN
INTRAVENOUS | Status: AC
  Filled 2014-04-28: qty 5

## 2014-04-28 MED ORDER — DEXAMETHASONE SODIUM PHOSPHATE 4 MG/ML IJ SOLN
INTRAMUSCULAR | Status: AC
  Filled 2014-04-28: qty 2

## 2014-04-28 MED ORDER — DEXAMETHASONE SODIUM PHOSPHATE 4 MG/ML IJ SOLN
INTRAMUSCULAR | Status: DC | PRN
  Administered 2014-04-28: 6 mg via INTRAVENOUS
  Administered 2014-04-28: 4 mg via INTRAVENOUS

## 2014-04-28 MED ORDER — AMPHETAMINE-DEXTROAMPHET ER 10 MG PO CP24: 20.0000 mg | ORAL_CAPSULE | Freq: Every day | ORAL | Status: DC

## 2014-04-28 MED ORDER — PROMETHAZINE HCL 25 MG/ML IJ SOLN
6.2500 mg | INTRAMUSCULAR | Status: DC | PRN
  Administered 2014-04-28: 6.25 mg via INTRAVENOUS

## 2014-04-28 MED ORDER — LIDOCAINE HCL (CARDIAC) 20 MG/ML IV SOLN
INTRAVENOUS | Status: DC | PRN
  Administered 2014-04-28: 80 mg via INTRAVENOUS

## 2014-04-28 MED ORDER — ROCURONIUM BROMIDE 100 MG/10ML IV SOLN
INTRAVENOUS | Status: DC | PRN
  Administered 2014-04-28: 50 mg via INTRAVENOUS

## 2014-04-28 MED ORDER — ACETAMINOPHEN 650 MG RE SUPP: 650.0000 mg | Freq: Four times a day (QID) | RECTAL | Status: DC | PRN

## 2014-04-28 MED ORDER — FENTANYL CITRATE 0.05 MG/ML IJ SOLN
INTRAMUSCULAR | Status: DC | PRN
  Administered 2014-04-28: 100 ug via INTRAVENOUS
  Administered 2014-04-28 (×4): 50 ug via INTRAVENOUS

## 2014-04-28 MED ORDER — OXYCODONE-ACETAMINOPHEN 5-325 MG PO TABS
1.0000 | ORAL_TABLET | ORAL | Status: DC | PRN
  Administered 2014-04-28 – 2014-04-29 (×3): 2 via ORAL
  Filled 2014-04-28 (×3): qty 2

## 2014-04-28 MED ORDER — DEXAMETHASONE SODIUM PHOSPHATE 4 MG/ML IJ SOLN
INTRAMUSCULAR | Status: AC
  Filled 2014-04-28: qty 1

## 2014-04-28 MED ORDER — NEOSTIGMINE METHYLSULFATE 10 MG/10ML IV SOLN
INTRAVENOUS | Status: DC | PRN
  Administered 2014-04-28: 4 mg via INTRAVENOUS

## 2014-04-28 MED ORDER — SENNOSIDES-DOCUSATE SODIUM 8.6-50 MG PO TABS
1.0000 | ORAL_TABLET | Freq: Every evening | ORAL | Status: DC | PRN
  Filled 2014-04-28: qty 1

## 2014-04-28 MED ORDER — PHENOL 1.4 % MT LIQD
1.0000 | OROMUCOSAL | Status: DC | PRN
  Administered 2014-04-29: 1 via OROMUCOSAL
  Filled 2014-04-28: qty 177

## 2014-04-28 MED ORDER — GLYCOPYRROLATE 0.2 MG/ML IJ SOLN
INTRAMUSCULAR | Status: DC | PRN
  Administered 2014-04-28: .5 mg via INTRAVENOUS

## 2014-04-28 MED ORDER — PROMETHAZINE HCL 25 MG/ML IJ SOLN
INTRAMUSCULAR | Status: AC
  Filled 2014-04-28: qty 1

## 2014-04-28 MED ORDER — VANCOMYCIN HCL IN DEXTROSE 1-5 GM/200ML-% IV SOLN
INTRAVENOUS | Status: AC
  Administered 2014-04-28: 1000 mg via INTRAVENOUS
  Filled 2014-04-28: qty 200

## 2014-04-28 MED ORDER — DIAZEPAM 5 MG PO TABS
5.0000 mg | ORAL_TABLET | Freq: Four times a day (QID) | ORAL | Status: DC | PRN
  Administered 2014-04-28 – 2014-04-29 (×2): 5 mg via ORAL
  Filled 2014-04-28 (×2): qty 1

## 2014-04-28 MED ORDER — THROMBIN 5000 UNITS EX SOLR
CUTANEOUS | Status: DC | PRN
  Administered 2014-04-28 (×2): 5000 [IU] via TOPICAL

## 2014-04-28 MED ORDER — FENTANYL CITRATE 0.05 MG/ML IJ SOLN
INTRAMUSCULAR | Status: AC
  Filled 2014-04-28: qty 5

## 2014-04-28 MED ORDER — DEXTROSE 5 % IV SOLN
INTRAVENOUS | Status: DC | PRN
  Administered 2014-04-28: 17:00:00 via INTRAVENOUS

## 2014-04-28 MED ORDER — MORPHINE SULFATE 2 MG/ML IJ SOLN
1.0000 mg | INTRAMUSCULAR | Status: DC | PRN
  Administered 2014-04-28 (×3): 2 mg via INTRAVENOUS

## 2014-04-28 MED ORDER — ARTIFICIAL TEARS OP OINT
TOPICAL_OINTMENT | OPHTHALMIC | Status: DC | PRN
  Administered 2014-04-28: 1 via OPHTHALMIC

## 2014-04-28 MED ORDER — 0.9 % SODIUM CHLORIDE (POUR BTL) OPTIME
TOPICAL | Status: DC | PRN
  Administered 2014-04-28: 1000 mL

## 2014-04-28 MED ORDER — PROPOFOL 10 MG/ML IV BOLUS
INTRAVENOUS | Status: DC | PRN
  Administered 2014-04-28: 200 mg via INTRAVENOUS

## 2014-04-28 MED ORDER — ACETAMINOPHEN 325 MG PO TABS: 650.0000 mg | ORAL_TABLET | Freq: Four times a day (QID) | ORAL | Status: DC | PRN

## 2014-04-28 MED ORDER — HYDROCODONE-ACETAMINOPHEN 5-325 MG PO TABS: 1.0000 | ORAL_TABLET | ORAL | Status: DC | PRN

## 2014-04-28 MED ORDER — SODIUM CHLORIDE 0.9 % IJ SOLN: 3.0000 mL | INTRAMUSCULAR | Status: DC | PRN

## 2014-04-28 MED ORDER — MORPHINE SULFATE 2 MG/ML IJ SOLN
INTRAMUSCULAR | Status: AC
  Filled 2014-04-28: qty 2

## 2014-04-28 MED ORDER — MIDAZOLAM HCL 5 MG/5ML IJ SOLN
INTRAMUSCULAR | Status: DC | PRN
  Administered 2014-04-28: 1 mg via INTRAVENOUS

## 2014-04-28 SURGICAL SUPPLY — 74 items
BIT DRILL NEURO 2X3.1 SFT TUCH (MISCELLANEOUS) ×1 IMPLANT
BLADE 10 SAFETY STRL DISP (BLADE) ×2 IMPLANT
BLADE SURG ROTATE 9660 (MISCELLANEOUS) IMPLANT
BNDG GAUZE ELAST 4 BULKY (GAUZE/BANDAGES/DRESSINGS) IMPLANT
BUR DRUM 4.0 (BURR) IMPLANT
CANISTER SUCT 3000ML (MISCELLANEOUS) ×2 IMPLANT
CONT SPEC 4OZ CLIKSEAL STRL BL (MISCELLANEOUS) ×2 IMPLANT
DECANTER SPIKE VIAL GLASS SM (MISCELLANEOUS) ×2 IMPLANT
DERMABOND ADHESIVE PROPEN (GAUZE/BANDAGES/DRESSINGS) ×1
DERMABOND ADVANCED (GAUZE/BANDAGES/DRESSINGS) ×1
DERMABOND ADVANCED .7 DNX12 (GAUZE/BANDAGES/DRESSINGS) ×1 IMPLANT
DERMABOND ADVANCED .7 DNX6 (GAUZE/BANDAGES/DRESSINGS) ×1 IMPLANT
DRAPE LAPAROTOMY 100X72 PEDS (DRAPES) ×2 IMPLANT
DRAPE MICROSCOPE LEICA (MISCELLANEOUS) ×2 IMPLANT
DRAPE POUCH INSTRU U-SHP 10X18 (DRAPES) ×2 IMPLANT
DRAPE PROXIMA HALF (DRAPES) IMPLANT
DRILL NEURO 2X3.1 SOFT TOUCH (MISCELLANEOUS) ×2
DURAPREP 6ML APPLICATOR 50/CS (WOUND CARE) ×2 IMPLANT
ELECT COATED BLADE 2.86 ST (ELECTRODE) ×2 IMPLANT
ELECT REM PT RETURN 9FT ADLT (ELECTROSURGICAL) ×2
ELECTRODE REM PT RTRN 9FT ADLT (ELECTROSURGICAL) ×1 IMPLANT
GAUZE SPONGE 4X4 16PLY XRAY LF (GAUZE/BANDAGES/DRESSINGS) IMPLANT
GLOVE BIO SURGEON STRL SZ 6.5 (GLOVE) IMPLANT
GLOVE BIO SURGEON STRL SZ7 (GLOVE) IMPLANT
GLOVE BIO SURGEON STRL SZ7.5 (GLOVE) IMPLANT
GLOVE BIO SURGEON STRL SZ8 (GLOVE) IMPLANT
GLOVE BIO SURGEON STRL SZ8.5 (GLOVE) IMPLANT
GLOVE BIOGEL M 8.0 STRL (GLOVE) IMPLANT
GLOVE BIOGEL PI IND STRL 7.5 (GLOVE) ×1 IMPLANT
GLOVE BIOGEL PI INDICATOR 7.5 (GLOVE) ×1
GLOVE ECLIPSE 6.5 STRL STRAW (GLOVE) ×4 IMPLANT
GLOVE ECLIPSE 7.0 STRL STRAW (GLOVE) ×4 IMPLANT
GLOVE ECLIPSE 7.5 STRL STRAW (GLOVE) IMPLANT
GLOVE ECLIPSE 8.0 STRL XLNG CF (GLOVE) IMPLANT
GLOVE ECLIPSE 8.5 STRL (GLOVE) IMPLANT
GLOVE EXAM NITRILE LRG STRL (GLOVE) IMPLANT
GLOVE EXAM NITRILE MD LF STRL (GLOVE) IMPLANT
GLOVE EXAM NITRILE XL STR (GLOVE) IMPLANT
GLOVE EXAM NITRILE XS STR PU (GLOVE) IMPLANT
GLOVE INDICATOR 6.5 STRL GRN (GLOVE) IMPLANT
GLOVE INDICATOR 7.0 STRL GRN (GLOVE) IMPLANT
GLOVE INDICATOR 7.5 STRL GRN (GLOVE) IMPLANT
GLOVE INDICATOR 8.0 STRL GRN (GLOVE) IMPLANT
GLOVE INDICATOR 8.5 STRL (GLOVE) IMPLANT
GLOVE OPTIFIT SS 8.0 STRL (GLOVE) IMPLANT
GLOVE SURG SS PI 6.5 STRL IVOR (GLOVE) IMPLANT
GOWN STRL REUS W/ TWL LRG LVL3 (GOWN DISPOSABLE) ×2 IMPLANT
GOWN STRL REUS W/ TWL XL LVL3 (GOWN DISPOSABLE) ×1 IMPLANT
GOWN STRL REUS W/TWL 2XL LVL3 (GOWN DISPOSABLE) IMPLANT
GOWN STRL REUS W/TWL LRG LVL3 (GOWN DISPOSABLE) ×2
GOWN STRL REUS W/TWL XL LVL3 (GOWN DISPOSABLE) ×1
KIT BASIN OR (CUSTOM PROCEDURE TRAY) ×2 IMPLANT
KIT ROOM TURNOVER OR (KITS) ×2 IMPLANT
NEEDLE HYPO 25X1 1.5 SAFETY (NEEDLE) ×2 IMPLANT
NEEDLE SPNL 22GX3.5 QUINCKE BK (NEEDLE) ×2 IMPLANT
NS IRRIG 1000ML POUR BTL (IV SOLUTION) ×2 IMPLANT
PACK LAMINECTOMY NEURO (CUSTOM PROCEDURE TRAY) ×2 IMPLANT
PAD ARMBOARD 7.5X6 YLW CONV (MISCELLANEOUS) ×6 IMPLANT
PIN DISTRACTION 14MM (PIN) ×4 IMPLANT
PLATE HELIX R 22MM (Plate) ×2 IMPLANT
RUBBERBAND STERILE (MISCELLANEOUS) ×4 IMPLANT
SCREW 4.0X13 (Screw) ×4 IMPLANT
SCREW 4.0X13MM (Screw) ×4 IMPLANT
SPACER ACF PARALLEL 7MM (Bone Implant) ×2 IMPLANT
SPONGE INTESTINAL PEANUT (DISPOSABLE) ×2 IMPLANT
SPONGE SURGIFOAM ABS GEL SZ50 (HEMOSTASIS) ×2 IMPLANT
SUT SILK 2 0 TIES 10X30 (SUTURE) ×2 IMPLANT
SUT VIC AB 0 CT1 27 (SUTURE) ×1
SUT VIC AB 0 CT1 27XBRD ANTBC (SUTURE) ×1 IMPLANT
SUT VIC AB 3-0 SH 8-18 (SUTURE) ×2 IMPLANT
SYR 20ML ECCENTRIC (SYRINGE) ×2 IMPLANT
TOWEL OR 17X24 6PK STRL BLUE (TOWEL DISPOSABLE) ×2 IMPLANT
TOWEL OR 17X26 10 PK STRL BLUE (TOWEL DISPOSABLE) ×2 IMPLANT
WATER STERILE IRR 1000ML POUR (IV SOLUTION) ×2 IMPLANT

## 2014-04-28 NOTE — Anesthesia Preprocedure Evaluation (Signed)
Anesthesia Evaluation  Patient identified by MRN, date of birth, ID band Patient awake    Reviewed: Allergy & Precautions, H&P , NPO status , Patient's Chart, lab work & pertinent test results  History of Anesthesia Complications Negative for: history of anesthetic complications  Airway Mallampati: I  Neck ROM: Limited    Dental  (+) Teeth Intact   Pulmonary  breath sounds clear to auscultation        Cardiovascular negative cardio ROS  Rhythm:Regular Rate:Normal     Neuro/Psych negative neurological ROS     GI/Hepatic negative GI ROS, Neg liver ROS,   Endo/Other  negative endocrine ROS  Renal/GU negative Renal ROS     Musculoskeletal   Abdominal   Peds  Hematology negative hematology ROS (+)   Anesthesia Other Findings   Reproductive/Obstetrics                           Anesthesia Physical Anesthesia Plan  ASA: I  Anesthesia Plan: General   Post-op Pain Management:    Induction: Intravenous  Airway Management Planned: Oral ETT  Additional Equipment:   Intra-op Plan:   Post-operative Plan: Extubation in OR  Informed Consent: I have reviewed the patients History and Physical, chart, labs and discussed the procedure including the risks, benefits and alternatives for the proposed anesthesia with the patient or authorized representative who has indicated his/her understanding and acceptance.   Dental advisory given  Plan Discussed with:   Anesthesia Plan Comments:         Anesthesia Quick Evaluation

## 2014-04-28 NOTE — Anesthesia Postprocedure Evaluation (Signed)
  Anesthesia Post-op Note  Patient: Courtney Parsons  Procedure(s) Performed: Procedure(s): ANTERIOR CERVICAL DECOMPRESSION/DISCECTOMY FUSION 1 LEVEL Cervical six/seven (N/A)  Patient Location: PACU  Anesthesia Type:General  Level of Consciousness: awake, alert , oriented and patient cooperative  Airway and Oxygen Therapy: Patient Spontanous Breathing and Patient connected to nasal cannula oxygen  Post-op Pain: mild  Post-op Assessment: Post-op Vital signs reviewed, Patient's Cardiovascular Status Stable, Respiratory Function Stable, Patent Airway and Pain level controlled, nausea improved  Post-op Vital Signs: Reviewed and stable  Last Vitals:  Filed Vitals:   04/28/14 2015  BP: 122/71  Pulse: 50  Temp:   Resp: 25    Complications: No apparent anesthesia complications

## 2014-04-28 NOTE — Anesthesia Procedure Notes (Signed)
Procedure Name: Intubation Date/Time: 04/28/2014 5:28 PM Performed by: Wray KearnsFOLEY, Kaylen Nghiem A Pre-anesthesia Checklist: Patient identified, Timeout performed, Emergency Drugs available, Suction available and Patient being monitored Patient Re-evaluated:Patient Re-evaluated prior to inductionOxygen Delivery Method: Circle system utilized Preoxygenation: Pre-oxygenation with 100% oxygen Intubation Type: IV induction and Cricoid Pressure applied Ventilation: Mask ventilation without difficulty Laryngoscope Size: Mac and 3 Grade View: Grade I Tube type: Oral Tube size: 6.5 mm Number of attempts: 1 Airway Equipment and Method: Stylet Placement Confirmation: ETT inserted through vocal cords under direct vision,  breath sounds checked- equal and bilateral and positive ETCO2 Secured at: 21 cm Tube secured with: Tape Dental Injury: Teeth and Oropharynx as per pre-operative assessment

## 2014-04-28 NOTE — Op Note (Signed)
04/28/2014  7:31 PM  PATIENT:  Courtney Parsons  38 y.o. female presenting with a C7 radiculopathy on the left and a herniated disc eccentric to the left at C6/7  PRE-OPERATIVE DIAGNOSIS:  C6/7 displaced disc, left C7 radiculopathy  POST-OPERATIVE DIAGNOSIS:  cervical six/seven displaced disc, left radiculopathy  PROCEDURE:  Anterior Cervical decompression C6/7 Arthrodesis C6/7 with 7mm structural allograft Anterior instrumentation(Nuvasive, HelixR) C6/7  SURGEON:  Surgeon(s): Carmela HurtKyle L Saima Monterroso, MD  ASSISTANTS:none  ANESTHESIA:   general  EBL:  Total I/O In: 250 [I.V.:250] Out: -   BLOOD ADMINISTERED:none  CELL SAVER GIVEN:none  COUNT:per nursing  DRAINS: none   SPECIMEN:  No Specimen  DICTATION: [name] was taken to the operating room, intubated, and placed under general anesthesia without difficulty. She was positioned supine with her head in slight extension on a horseshoe headrest. The neck was prepped and draped in a sterile manner. I infiltrated 1 cc's 1/2%lidocaine/1:200,000 strength epinephrine into the planned incision starting from the midline to the medial border of the left sternocleidomastoid muscle. I opened the incision with a 10 blade and dissected sharply through soft tissue to the platysma. I dissected in the plane superior to the platysma both rostrally and caudally. I then opened the platysma in a horizontal fashion with Metzenbaum scissors, and dissected in the inferior plane rostrally and caudally. With both blunt and sharp technique I created an avascular corridor to the cervical spine. I placed a spinal needle(s) in the disc space at C6/7 . I then reflected the longus colli from C6 to C7 and placed self retaining retractors. I opened the disc space(s) at C6/7 with a 15 blade. I removed disc with curettes, Kerrison punches, and the drill. Using the drill I removed osteophytes and prepared for the decompression.  I decompressed the spinal canal and the C7  root(s) with the drill, Kerrison punches, and the curettes. I used the microscope to aid in microdissection. I removed the posterior longitudinal ligament to fully expose and decompress the thecal sac. I exposed the roots laterally taking down the C6/7 uncovertebral joints. With the decompression complete I moved on to the arthrodesis. I used the drill to level the surfaces of C6/7. I removed soft tissue to prepare the disc space and the bony surfaces. I measured the space and placed a 7mm structural allograft into the disc space.  I then placed the anterior instrumentation. I placed 2 screws in each vertebral body through the plate. I locked the screws into place. Intraoperative xray showed the graft, plate, and screws to be in good position. I irrigated the wound, achieved hemostasis, and closed the wound in layers. I approximated the platysma, and the subcuticular plane with vicryl sutures. I used Dermabond for a sterile dressing.   PLAN OF CARE: Admit for overnight observation  PATIENT DISPOSITION:  PACU - hemodynamically stable.   Delay start of Pharmacological VTE agent (>24hrs) due to surgical blood loss or risk of bleeding:  yes

## 2014-04-28 NOTE — Transfer of Care (Signed)
Immediate Anesthesia Transfer of Care Note  Patient: Courtney Parsons  Procedure(s) Performed: Procedure(s): ANTERIOR CERVICAL DECOMPRESSION/DISCECTOMY FUSION 1 LEVEL Cervical six/seven (N/A)  Patient Location: PACU  Anesthesia Type:General  Level of Consciousness: awake, alert  and oriented  Airway & Oxygen Therapy: Patient Spontanous Breathing and Patient connected to nasal cannula oxygen  Post-op Assessment: Report given to PACU RN and Post -op Vital signs reviewed and stable  Post vital signs: Reviewed and stable  Complications: No apparent anesthesia complications

## 2014-04-28 NOTE — H&P (Signed)
BP 119/77  Pulse 75  Temp(Src) 99.1 F (37.3 C) (Oral)  SpO2 98%  Courtney Parsons is a 38 y.o. female Whom presented to my office today with severe pain in the left upper extremity, along with triceps weakness. The pain started on June 28th, when she awoke. The pain has been unrelenting despite antiinflammatory medication, steroids, rest, and time. 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 Mri shows a large disc herniation at C6/7 eccentric to the left. Denies bowel,bladder dysfunction.  Review of Systems  Constitutional: Negative.   Eyes: Negative.   Respiratory: Negative.   Cardiovascular: Negative.   Gastrointestinal: Negative.   Genitourinary: Negative.   Musculoskeletal: Positive for neck pain.  Skin: Negative.   Neurological: Positive for focal weakness.  Endo/Heme/Allergies: Negative.   Psychiatric/Behavioral: Negative.     Allergies  Allergen Reactions  . Dilaudid [Hydromorphone Hcl] Anaphylaxis  . Epinephrine Anaphylaxis    Difficulty breathing in the dentist office   . Cephalexin Hives   Past Medical History  Diagnosis Date  . ADD (attention deficit disorder)   . Ovarian cyst     h/o   Past Surgical History  Procedure Laterality Date  . Abdominal hysterectomy    . Cesarean section    . Pelvic laparoscopy     Family History  Problem Relation Age of Onset  . Heart disease Mother   . Diabetes Mother   . Hypertension Mother   . Diabetes Maternal Grandmother   . Heart disease Maternal Grandfather   . Heart attack Maternal Grandfather    History   Social History  . Marital Status: Divorced    Spouse Name: N/A    Number of Children: N/A  . Years of Education: N/A   Occupational History  . Not on file.   Social History Main Topics  . Smoking status: Never Smoker   . Smokeless tobacco: Never Used  . Alcohol Use: No  . Drug Use: No  . Sexual Activity: Yes    Birth Control/ Protection:  Surgical   Other Topics Concern  . Not on file   Social History Narrative  . No narrative on file   Prior to Admission medications   Medication Sig Start Date End Date Taking? Authorizing Provider  amphetamine-dextroamphetamine (ADDERALL XR) 20 MG 24 hr capsule Take 20 mg by mouth daily.   Yes Historical Provider, MD  cyclobenzaprine (FLEXERIL) 10 MG tablet Take 10 mg by mouth daily as needed for muscle spasms.   Yes Historical Provider, MD  diclofenac (VOLTAREN) 50 MG EC tablet Take 50 mg by mouth 2 (two) times daily with a meal.   Yes Historical Provider, MD  ibuprofen (ADVIL,MOTRIN) 200 MG tablet Take 200 mg by mouth every 6 (six) hours as needed. For pain   Yes Historical Provider, MD  acetaminophen (TYLENOL) 500 MG tablet Take 500 mg by mouth every 6 (six) hours as needed. For pain    Historical Provider, MD   No results found for this or any previous visit (from the past 24 hour(s)). [name]  04/27/2014   CLINICAL DATA:  Increasing neck pain radiating through the left shoulder and entire left arm with numbness extending to the left hand for the past 4 weeks.  EXAM: MRI CERVICAL SPINE WITHOUT CONTRAST  TECHNIQUE: Multiplanar, multisequence MR imaging of the cervical spine was performed. No intravenous contrast was administered.  COMPARISON:  None.  FINDINGS: Vertebral  alignment is within normal limits. Vertebral body heights are preserved without compression fracture. Vertebral bone marrow signal is within normal limits. Craniocervical junction is unremarkable. Cervical spinal cord is normal in signal. Paraspinal soft tissues are unremarkable.  C2-3:  Moderate left facet arthrosis without stenosis.  C3-4:  Mild to moderate bilateral facet arthrosis without stenosis.  C4-5: Mild right and severe left facet arthrosis results in moderate left neural foraminal stenosis. No spinal canal stenosis.  C5-6:  Mild facet arthrosis without stenosis.  C6-7: Large left paracentral disc  extrusion results in moderate left-sided spinal canal stenosis with mild impression on the left ventral spinal cord. The disc extrusion also extends into the left neural foramen, resulting in moderate to severe proximal foraminal stenosis with likely mass effect on the C7 nerve.  C7-T1: Left greater than right facet arthrosis results in mild left neural foraminal stenosis. No spinal canal stenosis.  IMPRESSION: 1. Large left-sided disc extrusion at C6-7 likely affecting the left C7 nerve in the proximal neural foramen as well as resulting in moderate left-sided spinal stenosis with mild impression on the spinal cord. 2. Prominent multilevel facet arthrosis, greatest at C4-5 where severe left facet arthrosis results in moderate left neural foraminal stenosis.   Electronically Signed   By: Sebastian Ache   On: 04/27/2014 21:55   Mr Thoracic Spine Wo Contrast  04/27/2014   CLINICAL DATA:  Increasing neck pain radiating through the left shoulder and entire left arm with numbness extending to the left hand for the past 4 weeks  EXAM: MRI THORACIC SPINE WITHOUT CONTRAST  TECHNIQUE: Multiplanar, multisequence MR imaging of the thoracic spine was performed. No intravenous contrast was administered.  COMPARISON:  Chest radiographs 09/08/2011  FINDINGS: Vertebral alignment is normal. Vertebral body heights are preserved without compression fracture. Vertebral bone marrow signal is within normal limits. No disc herniation, spinal canal stenosis, or neural foraminal stenosis is seen. There is minimal prominence of the central canal of the spinal cord in the mid thoracic spine without a frank syrinx. Spinal cord is otherwise normal in signal. Conus medullaris terminates at L1. Paraspinal soft tissues are unremarkable.  IMPRESSION: No thoracic disc herniation or stenosis.   Electronically Signed   By: Sebastian Ache   On: 04/27/2014 21:59   Physical Exam  Constitutional: She is oriented to person, place, and time. She appears  well-developed and well-nourished. She appears distressed.  HENT:  Head: Normocephalic and atraumatic.  Right Ear: External ear normal.  Left Ear: External ear normal.  Nose: Nose normal.  Mouth/Throat: Oropharynx is clear and moist.  Eyes: Conjunctivae and EOM are normal. Pupils are equal, round, and reactive to light. Right eye exhibits no discharge. Left eye exhibits no discharge.  Neck:  Neck movement restricted due to pain  Cardiovascular: Normal rate, regular rhythm and normal heart sounds.   Pulmonary/Chest: Effort normal and breath sounds normal.  Abdominal: Soft.  Musculoskeletal: Normal range of motion.  Neurological: She is alert and oriented to person, place, and time. She has normal reflexes. She displays no tremor. No cranial nerve deficit. She exhibits normal muscle tone. She displays no Babinski's sign on the right side. She displays no Babinski's sign on the left side.  Reflex Scores:      Tricep reflexes are 2+ on the right side and 2+ on the left side.      Bicep reflexes are 2+ on the right side and 2+ on the left side.      Brachioradialis reflexes are 2+  on the right side and 2+ on the left side.      Patellar reflexes are 2+ on the right side and 2+ on the left side.      Achilles reflexes are 2+ on the right side and 2+ on the left side. Weakness left triceps, wrist extensors, intrinsics. Normal elsewhere.  Assessment/Plan: BP 119/77  Pulse 75  Temp(Src) 99.1 F (37.3 C) (Oral)  SpO2 98% Ms. Mayford KnifeWilliams has decided to undergo an anterior cervical decompression and arthrodesis for a herniated disc at levels C6/7. Risks and benefits including but not limited to bleeding, infection, paralysis, weakness in one or both extremities, bowel and/or bladder dysfunction, fusion failure, hardware failure, need for further surgery, no relief of pain. She understands and wishes to proceed.

## 2014-04-29 DIAGNOSIS — M5020 Other cervical disc displacement, unspecified cervical region: Secondary | ICD-10-CM | POA: Diagnosis not present

## 2014-04-29 MED ORDER — OXYCODONE-ACETAMINOPHEN 5-325 MG PO TABS: 1.0000 | ORAL_TABLET | Freq: Four times a day (QID) | ORAL | Status: DC | PRN

## 2014-04-29 NOTE — Progress Notes (Signed)
Orthopedic Tech Progress Note Patient Details:  Courtney Parsons 1976/01/27 [id number]  Ortho Devices Type of Ortho Device: Soft collar Ortho Device/Splint Interventions: Application   Asia R Thompson 04/29/2014, 10:11 AM

## 2014-04-29 NOTE — Discharge Instructions (Addendum)
Anterior Cervical Fusion °Care After °Pinching of the nerves is a common cause of long-term pain. When this happens, a procedure called an anterior cervical fusion is sometimes performed. It relieves the pressure on the pinched nerve roots or spinal cord in the neck. °An anterior cervical fusion means that the operation is done through the front (anterior) of your neck to fuse bones in your neck together. This procedure is done to relieve the pressure on pinched nerve roots or spinal cord. This operation is done to control the movement of your spine, which may be pressing on the nerves. This may relieve the pain. The procedure that stops the movement of the spine is called a fusion. The cut by the surgeon (incision) is usually within a skin fold line under your chin. After moving the neck muscles gently apart, the neurosurgeon uses an operating microscope and removes the injured intervertebral disk (the cushion or pad of tissue between the bones of the spine). This takes the pressure off the nerves or spinal cord. This is called decompression. The area where the disc was removed is then filled with a bone graft. The graft will fuse the vertebrae together over time. This means it causes the vertebral bodies to grow together. The bone graft may be obtained from your own bone (your hip for example), or may be obtained from a bone bank. Receiving bone from a bone bank is similar to a blood bank, only the bone comes from human donors who have recently died. This type of graft is referred to as allograft bone. The preformed bone plug is safe and will not be rejected by your body. It does not contain blood cells. °In some cases, the surgeon may use hardware in your neck to help stabilize it. This means that metal plates or pins or screws may be used to: °· Provide extra support to the neck.  °· Help the bones to grow together more easily.  °A cervical fusion procedure takes a couple hours to several hours, depending on  what needs to be done. Your caregiver will be able to answer your questions for you. °HOME CARE INSTRUCTIONS  °· It will be normal to have a sore throat and have difficulty swallowing foods for a couple weeks following surgery. See your caregiver if this seems to be getting worse rather than better.  °· You may resume normal diet and activities as directed or allowed. Generally, walking and stair climbing are fine. Avoid lifting more than ten pounds and do no lifting above your head.  °· If given a cervical collar, remove only for bathing and eating, or as directed.  °· Use only showers for cleaning up, with no bathing, until seen.  °· You may apply ice to the surgical or bone donor site for 15 to 20 minutes each hour while awake for the first couple days following surgery. Put the ice in a plastic bag and place a towel between the bag of ice and your skin.  °· Change dressings if necessary or as directed.  °· You may drive in 10 days  °· Take prescribed medication as directed. Only take over-the-counter or prescription medicines for pain, discomfort, or fever as directed by your caregiver.  °· Make an appointment to see your caregiver for suture or staple removal when instructed.  °· If physical therapy was prescribed, follow your caregiver's directions.  °SEEK IMMEDIATE MEDICAL CARE IF: °· There is redness, swelling, or increasing pain in the wound.  °· There is   pus coming from the wound.  °· An unexplained oral temperature over 102° F (38.9° C) develops.  °· There is a bad smell coming from the wound or dressing.  °· You have swelling in your calf or leg.  °· You develop shortness of breath or chest pain.  °· The wound edges break open after sutures or staples have been removed.  °· Your pain is not controlled with medicine.  °· You seem to be getting worse rather than better.  °Document Released: 04/29/2004 Document Revised: 05/28/2011 Document Reviewed: 07/05/2008 °ExitCare® Patient Information ©2012 [employer]. ° ° ° °Wound Care °Leave incision open to air. °You may shower. °Do not scrub directly on incision.  °Do not put any creams, lotions, or ointments on incision. °Activity °Walk each and every day, increasing distance each day. °No lifting greater than 5 lbs.  Avoid excessive neck motion. °No driving for 2 weeks; may ride as a passenger locally. °Wear neck brace at all times except when showering.  If provided soft collar, may wear for comfort unless otherwise instructed. °Diet °Resume your normal diet.  °Return to Work °Will be discussed at you follow up appointment. °Call Your Doctor If Any of These Occur °Redness, drainage, or swelling at the wound.  °Temperature greater than 101 degrees. °Severe pain not relieved by pain medication. °Increased difficulty swallowing. °Incision starts to come apart. °Follow Up Appt °Call today for appointment in 4 weeks ([phone]) or for problems.  If you have any hardware placed in your spine, you will need an x-ray before your appointment. °

## 2014-04-29 NOTE — Progress Notes (Signed)
Patient alert and oriented, mae's well, voiding adequate amount of urine, swallowing without difficulty, c/o moderate pain and meds given prior to discharged. Patient discharged home with family. Script and discharged instructions given to patient. Patient and family stated understanding of instructions given.  

## 2014-04-29 NOTE — Discharge Summary (Signed)
  Physician Discharge Summary  Patient ID: Courtney Parsons L Winstanley MRN: [id number] DOB/AGE: 1976-04-13 38 y.o.  Admit date: 04/28/2014 Discharge date: 04/29/2014  Admission Diagnoses:HNP C6/7, Left C7 radiculopathy  Discharge Diagnoses:  Active Problems:   HNP (herniated nucleus pulposus), cervical   Discharged Condition: good  Hospital Course: [name] was admitted and taken to the operating room for an uncomplicated ACDF(Nuvasive HelixR) at C6/7. She presented with significant weakness and pain in the left upper extremity and triceps. Post op she is voiding, ambulating, and tolerating a regular diet. Her wound is clean, dry, and without signs of infection. speAking voice is strong.   Treatments: surgery: Anterior Cervical decompression C6/7 Arthrodesis C6/7 with 7mm structural allograft Anterior instrumentation(Nuvasive, HelixR) C6/7   Discharge Exam: Blood pressure 102/67, pulse 69, temperature 98.7 F (37.1 C), temperature source Oral, resp. rate 18, SpO2 99.00%. General appearance: alert, cooperative, appears stated age and mild distress Neurologic: Mental status: Alert, oriented, thought content appropriate Cranial nerves: normal Sensory: normal Motor: weakness in the left triceps, left wrist extensors  Disposition: 01-Home or Self Care C6/7 displaced disc, left C7 radiculopathy    Medication List    STOP taking these medications       diclofenac 50 MG EC tablet  Commonly known as:  VOLTAREN     ibuprofen 200 MG tablet  Commonly known as:  ADVIL,MOTRIN      TAKE these medications       acetaminophen 500 MG tablet  Commonly known as:  TYLENOL  Take 500 mg by mouth every 6 (six) hours as needed. For pain     amphetamine-dextroamphetamine 20 MG 24 hr capsule  Commonly known as:  ADDERALL XR  Take 20 mg by mouth daily.     cyclobenzaprine 10 MG tablet  Commonly known as:  FLEXERIL  Take 10 mg by mouth daily as needed for muscle spasms.     oxyCODONE-acetaminophen 5-325 MG per tablet  Commonly known as:  ROXICET  Take 1 tablet by mouth every 6 (six) hours as needed for severe pain.           Follow-up Information   Follow up with Alizah Sills L, MD In 3 weeks. (call office to make an appointment)    Specialty:  Neurosurgery   Contact information:   [address] [place] [address] [phone]       Signed: Tomesha Sargent L 04/29/2014, 7:41 AM

## 2014-05-01 ENCOUNTER — Encounter (HOSPITAL_COMMUNITY): Payer: Self-pay | Admitting: Neurosurgery

## 2014-05-04 ENCOUNTER — Ambulatory Visit (HOSPITAL_COMMUNITY): Payer: 59

## 2014-06-20 ENCOUNTER — Other Ambulatory Visit (HOSPITAL_COMMUNITY): Payer: Self-pay | Admitting: Neurosurgery

## 2014-06-20 DIAGNOSIS — M5020 Other cervical disc displacement, unspecified cervical region: Secondary | ICD-10-CM

## 2014-06-21 ENCOUNTER — Ambulatory Visit (HOSPITAL_COMMUNITY): Payer: 59

## 2014-06-21 ENCOUNTER — Ambulatory Visit (HOSPITAL_COMMUNITY)
Admission: RE | Admit: 2014-06-21 | Discharge: 2014-06-21 | Disposition: A | Discharge status: Home / Self Care | Payer: 59 | Source: Ambulatory Visit | Attending: Neurosurgery | Admitting: Neurosurgery

## 2014-06-21 DIAGNOSIS — M542 Cervicalgia: Secondary | ICD-10-CM | POA: Insufficient documentation

## 2014-06-21 DIAGNOSIS — Z981 Arthrodesis status: Secondary | ICD-10-CM | POA: Diagnosis not present

## 2014-06-21 DIAGNOSIS — M5020 Other cervical disc displacement, unspecified cervical region: Secondary | ICD-10-CM | POA: Diagnosis not present

## 2014-06-21 DIAGNOSIS — M4802 Spinal stenosis, cervical region: Secondary | ICD-10-CM | POA: Insufficient documentation

## 2014-06-28 ENCOUNTER — Encounter: Payer: Self-pay | Admitting: General Surgery

## 2014-07-31 ENCOUNTER — Encounter: Payer: Self-pay | Admitting: General Surgery

## 2014-08-09 ENCOUNTER — Telehealth: Payer: Self-pay | Admitting: *Deleted

## 2014-08-09 ENCOUNTER — Other Ambulatory Visit: Payer: Self-pay | Admitting: Women's Health

## 2014-08-09 MED ORDER — FLUCONAZOLE 150 MG PO TABS: 150.0000 mg | ORAL_TABLET | Freq: Once | ORAL | Status: DC

## 2014-08-09 NOTE — Telephone Encounter (Signed)
Pt called requesting Rx for Diflucan tablet due to yeast infection. Pt said you told her if she needed one to call, pt unable to make OV due to work. Please advise

## 2014-08-09 NOTE — Telephone Encounter (Signed)
Telephone call, states having white discharge with itching, no odor. Diflucan 150 will be E scribe, instructed to call  for office appointment if no relief.

## 2015-01-16 ENCOUNTER — Ambulatory Visit (HOSPITAL_COMMUNITY)
Admission: AD | Admit: 2015-01-16 | Discharge: 2015-01-16 | Disposition: A | Discharge status: Home / Self Care | Payer: 59 | Source: Ambulatory Visit | Attending: Gynecology | Admitting: Gynecology

## 2015-01-16 ENCOUNTER — Other Ambulatory Visit: Payer: Self-pay | Admitting: Women's Health

## 2015-01-16 ENCOUNTER — Ambulatory Visit (INDEPENDENT_AMBULATORY_CARE_PROVIDER_SITE_OTHER): Payer: 59

## 2015-01-16 ENCOUNTER — Ambulatory Visit (INDEPENDENT_AMBULATORY_CARE_PROVIDER_SITE_OTHER): Payer: 59 | Admitting: Women's Health

## 2015-01-16 ENCOUNTER — Encounter (HOSPITAL_COMMUNITY)
Admission: AD | Disposition: A | Discharge status: Home / Self Care | Payer: Self-pay | Source: Ambulatory Visit | Attending: Gynecology

## 2015-01-16 ENCOUNTER — Encounter: Payer: Self-pay | Admitting: Women's Health

## 2015-01-16 ENCOUNTER — Inpatient Hospital Stay (HOSPITAL_COMMUNITY): Payer: 59 | Admitting: Certified Registered Nurse Anesthetist

## 2015-01-16 ENCOUNTER — Ambulatory Visit: Admit: 2015-01-16 | Payer: Self-pay | Admitting: Gynecology

## 2015-01-16 ENCOUNTER — Encounter (HOSPITAL_COMMUNITY): Payer: Self-pay | Admitting: Certified Registered Nurse Anesthetist

## 2015-01-16 VITALS — BP 128/80 | Wt 131.0 lb

## 2015-01-16 DIAGNOSIS — Z9089 Acquired absence of other organs: Secondary | ICD-10-CM | POA: Diagnosis not present

## 2015-01-16 DIAGNOSIS — N898 Other specified noninflammatory disorders of vagina: Secondary | ICD-10-CM | POA: Diagnosis not present

## 2015-01-16 DIAGNOSIS — R188 Other ascites: Secondary | ICD-10-CM

## 2015-01-16 DIAGNOSIS — N736 Female pelvic peritoneal adhesions (postinfective): Secondary | ICD-10-CM | POA: Insufficient documentation

## 2015-01-16 DIAGNOSIS — N838 Other noninflammatory disorders of ovary, fallopian tube and broad ligament: Secondary | ICD-10-CM

## 2015-01-16 DIAGNOSIS — N830 Follicular cyst of ovary: Secondary | ICD-10-CM | POA: Insufficient documentation

## 2015-01-16 DIAGNOSIS — N839 Noninflammatory disorder of ovary, fallopian tube and broad ligament, unspecified: Secondary | ICD-10-CM

## 2015-01-16 DIAGNOSIS — F988 Other specified behavioral and emotional disorders with onset usually occurring in childhood and adolescence: Secondary | ICD-10-CM | POA: Insufficient documentation

## 2015-01-16 DIAGNOSIS — R1900 Intra-abdominal and pelvic swelling, mass and lump, unspecified site: Secondary | ICD-10-CM

## 2015-01-16 DIAGNOSIS — Z90710 Acquired absence of both cervix and uterus: Secondary | ICD-10-CM | POA: Diagnosis not present

## 2015-01-16 DIAGNOSIS — R1031 Right lower quadrant pain: Secondary | ICD-10-CM | POA: Diagnosis not present

## 2015-01-16 DIAGNOSIS — K668 Other specified disorders of peritoneum: Secondary | ICD-10-CM | POA: Diagnosis not present

## 2015-01-16 DIAGNOSIS — N8320 Unspecified ovarian cysts: Secondary | ICD-10-CM | POA: Diagnosis not present

## 2015-01-16 HISTORY — PX: LAPAROSCOPIC UNILATERAL SALPINGECTOMY: SHX5934

## 2015-01-16 HISTORY — PX: LAPAROSCOPIC OVARIAN CYSTECTOMY: SHX6248

## 2015-01-16 LAB — WET PREP FOR TRICH, YEAST, CLUE
Clue Cells Wet Prep HPF POC: NONE SEEN
Trich, Wet Prep: NONE SEEN
WBC WET PREP: NONE SEEN
Yeast Wet Prep HPF POC: NONE SEEN

## 2015-01-16 LAB — CBC
HEMATOCRIT: 37.5 % (ref 36.0–46.0)
Hemoglobin: 12.8 g/dL (ref 12.0–15.0)
MCH: 29.9 pg (ref 26.0–34.0)
MCHC: 34.1 g/dL (ref 30.0–36.0)
MCV: 87.6 fL (ref 78.0–100.0)
Platelets: 323 10*3/uL (ref 150–400)
RBC: 4.28 MIL/uL (ref 3.87–5.11)
RDW: 12.9 % (ref 11.5–15.5)
WBC: 8.5 10*3/uL (ref 4.0–10.5)

## 2015-01-16 LAB — ABO/RH: ABO/RH(D): O POS

## 2015-01-16 LAB — TYPE AND SCREEN
ABO/RH(D): O POS
Antibody Screen: NEGATIVE

## 2015-01-16 SURGERY — EXCISION, CYST, OVARY, LAPAROSCOPIC
Anesthesia: General | Laterality: Right

## 2015-01-16 MED ORDER — OXYCODONE HCL 5 MG PO TABS: 5.0000 mg | ORAL_TABLET | Freq: Once | ORAL | Status: DC | PRN

## 2015-01-16 MED ORDER — ROCURONIUM BROMIDE 100 MG/10ML IV SOLN
INTRAVENOUS | Status: DC | PRN
  Administered 2015-01-16: 10 mg via INTRAVENOUS
  Administered 2015-01-16: 20 mg via INTRAVENOUS
  Administered 2015-01-16 (×2): 10 mg via INTRAVENOUS

## 2015-01-16 MED ORDER — NEOSTIGMINE METHYLSULFATE 10 MG/10ML IV SOLN
INTRAVENOUS | Status: DC | PRN
  Administered 2015-01-16: 3 mg via INTRAVENOUS

## 2015-01-16 MED ORDER — ROCURONIUM BROMIDE 100 MG/10ML IV SOLN
INTRAVENOUS | Status: AC
  Filled 2015-01-16: qty 1

## 2015-01-16 MED ORDER — GLYCOPYRROLATE 0.2 MG/ML IJ SOLN
INTRAMUSCULAR | Status: DC | PRN
  Administered 2015-01-16: 0.4 mg via INTRAVENOUS

## 2015-01-16 MED ORDER — LACTATED RINGERS IV SOLN
INTRAVENOUS | Status: DC
  Administered 2015-01-16 (×4): via INTRAVENOUS

## 2015-01-16 MED ORDER — BUPIVACAINE HCL (PF) 0.25 % IJ SOLN
INTRAMUSCULAR | Status: AC
  Filled 2015-01-16: qty 30

## 2015-01-16 MED ORDER — LIDOCAINE HCL (CARDIAC) 20 MG/ML IV SOLN
INTRAVENOUS | Status: DC | PRN
  Administered 2015-01-16: 60 mg via INTRAVENOUS

## 2015-01-16 MED ORDER — OXYCODONE HCL 5 MG/5ML PO SOLN: 5.0000 mg | Freq: Once | ORAL | Status: DC | PRN

## 2015-01-16 MED ORDER — CLINDAMYCIN PHOSPHATE 900 MG/6ML IJ SOLN: INTRAMUSCULAR | Status: DC | PRN

## 2015-01-16 MED ORDER — LIDOCAINE HCL (CARDIAC) 20 MG/ML IV SOLN
INTRAVENOUS | Status: AC
  Filled 2015-01-16: qty 5

## 2015-01-16 MED ORDER — PROPOFOL 10 MG/ML IV BOLUS
INTRAVENOUS | Status: AC
  Filled 2015-01-16: qty 20

## 2015-01-16 MED ORDER — MORPHINE SULFATE 4 MG/ML IJ SOLN
4.0000 mg | Freq: Once | INTRAMUSCULAR | Status: AC
  Administered 2015-01-16: 4 mg via INTRAVENOUS
  Filled 2015-01-16: qty 1

## 2015-01-16 MED ORDER — DEXAMETHASONE SODIUM PHOSPHATE 4 MG/ML IJ SOLN
INTRAMUSCULAR | Status: AC
  Filled 2015-01-16: qty 1

## 2015-01-16 MED ORDER — KETOROLAC TROMETHAMINE 30 MG/ML IJ SOLN
INTRAMUSCULAR | Status: AC
  Filled 2015-01-16: qty 1

## 2015-01-16 MED ORDER — KETOROLAC TROMETHAMINE 30 MG/ML IM SOLN: 30.0000 mg | Freq: Once | INTRAMUSCULAR | Status: DC

## 2015-01-16 MED ORDER — MORPHINE SULFATE 4 MG/ML IJ SOLN: 1.0000 mg | INTRAMUSCULAR | Status: DC | PRN

## 2015-01-16 MED ORDER — FAMOTIDINE IN NACL 20-0.9 MG/50ML-% IV SOLN
20.0000 mg | Freq: Once | INTRAVENOUS | Status: AC
  Administered 2015-01-16: 20 mg via INTRAVENOUS
  Filled 2015-01-16: qty 50

## 2015-01-16 MED ORDER — GENTAMICIN SULFATE 40 MG/ML IJ SOLN: 5.0000 mg/kg | INTRAVENOUS | Status: DC

## 2015-01-16 MED ORDER — PROPOFOL 10 MG/ML IV BOLUS
INTRAVENOUS | Status: DC | PRN
  Administered 2015-01-16: 160 mg via INTRAVENOUS

## 2015-01-16 MED ORDER — PHENYLEPHRINE 40 MCG/ML (10ML) SYRINGE FOR IV PUSH (FOR BLOOD PRESSURE SUPPORT)
PREFILLED_SYRINGE | INTRAVENOUS | Status: AC
  Filled 2015-01-16: qty 10

## 2015-01-16 MED ORDER — SCOPOLAMINE 1 MG/3DAYS TD PT72
1.0000 | MEDICATED_PATCH | TRANSDERMAL | Status: DC
  Administered 2015-01-16: 1 via TRANSDERMAL
  Filled 2015-01-16: qty 1

## 2015-01-16 MED ORDER — CITRIC ACID-SODIUM CITRATE 334-500 MG/5ML PO SOLN
30.0000 mL | Freq: Once | ORAL | Status: AC
  Administered 2015-01-16: 30 mL via ORAL
  Filled 2015-01-16: qty 15

## 2015-01-16 MED ORDER — ONDANSETRON HCL 4 MG/2ML IJ SOLN
INTRAMUSCULAR | Status: DC | PRN
  Administered 2015-01-16: 4 mg via INTRAVENOUS

## 2015-01-16 MED ORDER — GENTAMICIN SULFATE 40 MG/ML IJ SOLN
Freq: Once | INTRAVENOUS | Status: AC
  Administered 2015-01-16: 112.5 mL via INTRAVENOUS
  Filled 2015-01-16: qty 6.5

## 2015-01-16 MED ORDER — NEOSTIGMINE METHYLSULFATE 10 MG/10ML IV SOLN
INTRAVENOUS | Status: AC
  Filled 2015-01-16: qty 1

## 2015-01-16 MED ORDER — MEPERIDINE HCL 50 MG PO TABS: 50.0000 mg | ORAL_TABLET | ORAL | Status: DC | PRN

## 2015-01-16 MED ORDER — KETOROLAC TROMETHAMINE 30 MG/ML IJ SOLN
30.0000 mg | Freq: Once | INTRAMUSCULAR | Status: AC
  Administered 2015-01-16: 30 mg via INTRAVENOUS

## 2015-01-16 MED ORDER — FENTANYL CITRATE (PF) 250 MCG/5ML IJ SOLN
INTRAMUSCULAR | Status: AC
  Filled 2015-01-16: qty 5

## 2015-01-16 MED ORDER — MIDAZOLAM HCL 2 MG/2ML IJ SOLN
INTRAMUSCULAR | Status: AC
  Filled 2015-01-16: qty 2

## 2015-01-16 MED ORDER — METOCLOPRAMIDE HCL 5 MG/ML IJ SOLN
INTRAMUSCULAR | Status: AC
  Filled 2015-01-16: qty 2

## 2015-01-16 MED ORDER — PHENYLEPHRINE HCL 10 MG/ML IJ SOLN
INTRAMUSCULAR | Status: DC | PRN
  Administered 2015-01-16 (×3): 40 ug via INTRAVENOUS

## 2015-01-16 MED ORDER — STERILE WATER FOR IRRIGATION IR SOLN
Status: DC | PRN
  Administered 2015-01-16: 1000 mL

## 2015-01-16 MED ORDER — SUCCINYLCHOLINE CHLORIDE 20 MG/ML IJ SOLN
INTRAMUSCULAR | Status: AC
  Filled 2015-01-16: qty 1

## 2015-01-16 MED ORDER — GLYCOPYRROLATE 0.2 MG/ML IJ SOLN
INTRAMUSCULAR | Status: AC
  Filled 2015-01-16: qty 2

## 2015-01-16 MED ORDER — FENTANYL CITRATE (PF) 100 MCG/2ML IJ SOLN
INTRAMUSCULAR | Status: AC
  Filled 2015-01-16: qty 2

## 2015-01-16 MED ORDER — FENTANYL CITRATE (PF) 100 MCG/2ML IJ SOLN
INTRAMUSCULAR | Status: DC | PRN
  Administered 2015-01-16 (×7): 50 ug via INTRAVENOUS

## 2015-01-16 MED ORDER — MORPHINE SULFATE 4 MG/ML IJ SOLN
INTRAMUSCULAR | Status: AC
  Filled 2015-01-16: qty 1

## 2015-01-16 MED ORDER — DEXAMETHASONE SODIUM PHOSPHATE 10 MG/ML IJ SOLN
INTRAMUSCULAR | Status: DC | PRN
  Administered 2015-01-16: 4 mg via INTRAVENOUS

## 2015-01-16 MED ORDER — SUCCINYLCHOLINE CHLORIDE 20 MG/ML IJ SOLN
INTRAMUSCULAR | Status: DC | PRN
  Administered 2015-01-16: 120 mg via INTRAVENOUS

## 2015-01-16 MED ORDER — MEPERIDINE HCL 25 MG/ML IJ SOLN: 6.2500 mg | INTRAMUSCULAR | Status: DC | PRN

## 2015-01-16 MED ORDER — BUPIVACAINE HCL (PF) 0.25 % IJ SOLN
INTRAMUSCULAR | Status: DC | PRN
  Administered 2015-01-16: 20 mL

## 2015-01-16 MED ORDER — METOCLOPRAMIDE HCL 5 MG/ML IJ SOLN
10.0000 mg | Freq: Once | INTRAMUSCULAR | Status: AC | PRN
  Administered 2015-01-16: 10 mg via INTRAVENOUS

## 2015-01-16 MED ORDER — METHYLENE BLUE 1 % INJ SOLN
INTRAMUSCULAR | Status: AC
  Filled 2015-01-16: qty 1

## 2015-01-16 MED ORDER — ONDANSETRON HCL 4 MG/2ML IJ SOLN
INTRAMUSCULAR | Status: AC
  Filled 2015-01-16: qty 2

## 2015-01-16 MED ORDER — MIDAZOLAM HCL 2 MG/2ML IJ SOLN
INTRAMUSCULAR | Status: DC | PRN
  Administered 2015-01-16: 2 mg via INTRAVENOUS

## 2015-01-16 MED ORDER — HEPARIN SODIUM (PORCINE) 5000 UNIT/ML IJ SOLN
INTRAMUSCULAR | Status: AC
Start: 2015-01-16 — End: 2015-01-16
  Filled 2015-01-16: qty 1

## 2015-01-16 MED ORDER — KETOROLAC TROMETHAMINE 30 MG/ML IJ SOLN
INTRAMUSCULAR | Status: DC | PRN
  Administered 2015-01-16: 30 mg via INTRAVENOUS

## 2015-01-16 MED ORDER — CLINDAMYCIN PHOSPHATE 900 MG/50ML IV SOLN: 900.0000 mg | INTRAVENOUS | Status: DC

## 2015-01-16 SURGICAL SUPPLY — 29 items
BARRIER ADHS 3X4 INTERCEED (GAUZE/BANDAGES/DRESSINGS) IMPLANT
CABLE HIGH FREQUENCY MONO STRZ (ELECTRODE) IMPLANT
CLOTH BEACON ORANGE TIMEOUT ST (SAFETY) ×2 IMPLANT
COVER MAYO STAND STRL (DRAPES) ×2 IMPLANT
DRSG COVADERM PLUS 2X2 (GAUZE/BANDAGES/DRESSINGS) ×4 IMPLANT
DRSG OPSITE POSTOP 3X4 (GAUZE/BANDAGES/DRESSINGS) ×2 IMPLANT
FILTER SMOKE EVAC LAPAROSHD (FILTER) ×2 IMPLANT
GLOVE BIOGEL PI IND STRL 8 (GLOVE) ×1 IMPLANT
GLOVE BIOGEL PI INDICATOR 8 (GLOVE) ×1
GLOVE ECLIPSE 7.5 STRL STRAW (GLOVE) ×4 IMPLANT
GOWN STRL REUS W/TWL LRG LVL3 (GOWN DISPOSABLE) ×4 IMPLANT
LIQUID BAND (GAUZE/BANDAGES/DRESSINGS) ×2 IMPLANT
NS IRRIG 1000ML POUR BTL (IV SOLUTION) ×2 IMPLANT
PACK LAPAROSCOPY BASIN (CUSTOM PROCEDURE TRAY) ×2 IMPLANT
PAD POSITIONER PINK NONSTERILE (MISCELLANEOUS) ×2 IMPLANT
POUCH SPECIMEN RETRIEVAL 10MM (ENDOMECHANICALS) ×2 IMPLANT
PROTECTOR NERVE ULNAR (MISCELLANEOUS) ×2 IMPLANT
SET IRRIG TUBING LAPAROSCOPIC (IRRIGATION / IRRIGATOR) ×2 IMPLANT
SHEARS HARMONIC ACE PLUS 36CM (ENDOMECHANICALS) IMPLANT
SLEEVE XCEL OPT CAN 5 100 (ENDOMECHANICALS) ×2 IMPLANT
SOLUTION ELECTROLUBE (MISCELLANEOUS) IMPLANT
SUT VIC AB 3-0 PS2 18 (SUTURE) ×1
SUT VIC AB 3-0 PS2 18XBRD (SUTURE) ×1 IMPLANT
SUT VICRYL 0 UR6 27IN ABS (SUTURE) ×2 IMPLANT
TOWEL OR 17X24 6PK STRL BLUE (TOWEL DISPOSABLE) ×4 IMPLANT
TRAY FOLEY CATH SILVER 14FR (SET/KITS/TRAYS/PACK) ×2 IMPLANT
TROCAR XCEL NON-BLD 11X100MML (ENDOMECHANICALS) ×2 IMPLANT
TROCAR XCEL NON-BLD 5MMX100MML (ENDOMECHANICALS) ×2 IMPLANT
WARMER LAPAROSCOPE (MISCELLANEOUS) ×2 IMPLANT

## 2015-01-16 NOTE — Transfer of Care (Signed)
Immediate Anesthesia Transfer of Care Note  Patient: Courtney Parsons  Procedure(s) Performed: Procedure(s): LAPAROSCOPIC OVARIAN CYSTECTOMY / EXTENSIVE ABDOMINAL LYSIS OF ADHESIONS (Right) LAPAROSCOPIC UNILATERAL SALPINGECTOMY (Right)  Patient Location: PACU  Anesthesia Type:General  Level of Consciousness: awake, alert , oriented and patient cooperative  Airway & Oxygen Therapy: Patient Spontanous Breathing and Patient connected to nasal cannula oxygen  Post-op Assessment: Report given to RN and Post -op Vital signs reviewed and stable  Post vital signs: Reviewed and stable  Last Vitals:  Filed Vitals:   01/16/15 1009  BP: 117/81  Pulse: 63  Temp: 37.3 C  Resp: 20    Complications: No apparent anesthesia complications

## 2015-01-16 NOTE — Anesthesia Postprocedure Evaluation (Signed)
  Anesthesia Post-op Note  Patient: Courtney Parsons  Procedure(s) Performed: Procedure(s): LAPAROSCOPIC OVARIAN CYSTECTOMY / EXTENSIVE ABDOMINAL LYSIS OF ADHESIONS (Right) LAPAROSCOPIC UNILATERAL SALPINGECTOMY (Right)  Patient Location: PACU  Anesthesia Type:General  Level of Consciousness: awake, alert  and oriented  Airway and Oxygen Therapy: Patient Spontanous Breathing and Patient connected to nasal cannula oxygen  Post-op Pain: mild  Post-op Assessment: Post-op Vital signs reviewed, Patient's Cardiovascular Status Stable, Respiratory Function Stable, Patent Airway, No signs of Nausea or vomiting and Pain level controlled  Post-op Vital Signs: Reviewed and stable  Last Vitals:  Filed Vitals:   01/16/15 1330  BP: 96/56  Pulse: 44  Temp:   Resp: 14    Complications: No apparent anesthesia complications

## 2015-01-16 NOTE — MAU Note (Signed)
Sent from MD office for surgery

## 2015-01-16 NOTE — Progress Notes (Signed)
Patient ID: Courtney Parsons, female   DOB: 07/30/1976, 39 y.o.   MRN: [id number] Presents with extreme lower right abdominal pain for 1 day, tearful. 2011 TAH and appendectomy for abdominal pain, questionable endometriosis. History of ovarian cysts causing similar type extreme pelvic pain. Nausea, vomited 1, denies urinary symptoms or vaginal discharge but states leaked clear fluid earlier today.   Exam: Tearful, appears very uncomfortable. Abdomen tender with rebound and radiation. Speculum exam no fluid noted, wet prep negative.  Ultrasound: 81 x 61 mm right ovarian cyst questionable hemorrhagic area and left ovary normal. Fluid in the cul-de-sac.  Dr. Lily PeerFernandez in an exam and, scheduled for surgical removal.

## 2015-01-16 NOTE — Progress Notes (Signed)
Transported to OR by CRNA. Was told RN did not need to accompany patient as CRNA received report at bedside in room 10.

## 2015-01-16 NOTE — Progress Notes (Signed)
Dr. Lily PeerFernandez here. CRNA present.

## 2015-01-16 NOTE — MAU Provider Note (Signed)
HPI: Courtney Parsons is a 39 y.o. year old [id number] female who presents to MAU for laparoscopy. Medical screening exam performed.   BP 117/81 mmHg  Pulse 63  Temp(Src) 99.1 F (37.3 C) (Oral)  Resp 20  Ht 5' 0.25" (1.53 m)  Wt 131 lb (59.421 kg)  BMI 25.38 kg/m2  [place] Mikhaila Roh, [place] 01/16/2015 10:28 AM

## 2015-01-16 NOTE — H&P (Addendum)
Courtney Parsons is an 39 y.o. female. SWF G1P1  Pertinent Gynecological History: 2011 TAH for chronic abdominal pelvic pain. History of ovarian cysts with cystectomy prior to TAH.  No LMP recorded. Patient has had a hysterectomy.    Past Medical History  Diagnosis Date  . ADD (attention deficit disorder)   . Ovarian cyst     h/o    Past Surgical History  Procedure Laterality Date  . Abdominal hysterectomy    . Cesarean section    . Pelvic laparoscopy    . Anterior cervical decomp/discectomy fusion N/A 04/28/2014    Procedure: ANTERIOR CERVICAL DECOMPRESSION/DISCECTOMY FUSION 1 LEVEL Cervical six/seven;  Surgeon: Carmela HurtKyle L Cabbell, MD;  Location: [facility];  Service: Neurosurgery;  Laterality: N/A;    Family History  Problem Relation Age of Onset  . Heart disease Mother   . Diabetes Mother   . Hypertension Mother   . Diabetes Maternal Grandmother   . Heart disease Maternal Grandfather   . Heart attack Maternal Grandfather     Social History:  reports that she has never smoked. She has never used smokeless tobacco. She reports that she does not drink alcohol or use illicit drugs.  Allergies:  Allergies  Allergen Reactions  . Dilaudid [Hydromorphone Hcl] Anaphylaxis  . Epinephrine Anaphylaxis    Difficulty breathing in the dentist office   . Cephalexin Hives     (Not in a hospital admission)  Review of Systems  Constitutional: Negative.  Negative for fever, chills, weight loss, malaise/fatigue and diaphoresis.  HENT: Negative.  Negative for congestion, ear discharge, ear pain, hearing loss, nosebleeds, sore throat and tinnitus.   Eyes: Negative.  Negative for blurred vision, double vision, photophobia, pain, discharge and redness.  Respiratory: Negative.  Negative for stridor.   Cardiovascular: Negative.   Gastrointestinal: Positive for nausea, vomiting and abdominal pain. Negative for heartburn, diarrhea, constipation, blood in stool and melena.  Genitourinary:  Negative.   Musculoskeletal: Negative.   Skin: Negative.  Negative for itching and rash.  Neurological: Negative.  Negative for weakness and headaches.  Endo/Heme/Allergies: Negative.   Psychiatric/Behavioral: Negative.     Blood pressure 128/80, weight 131 lb (59.421 kg). Physical Exam  Constitutional: She is oriented to person, place, and time. She appears well-developed and well-nourished. She appears distressed.  Cardiovascular: Normal rate, regular rhythm, normal heart sounds and intact distal pulses.   Respiratory: Effort normal and breath sounds normal.  GI: Soft. There is tenderness. There is rebound and guarding.  Genitourinary: Vagina normal. No vaginal discharge found.  Musculoskeletal: Normal range of motion. She exhibits no edema or tenderness.  Neurological: She is alert and oriented to person, place, and time. She has normal reflexes.  Skin: Skin is warm and dry. No rash noted. No erythema. No pallor.  Psychiatric: She has a normal mood and affect. Her behavior is normal. Judgment and thought content normal.    No results found for this or any previous visit (from the past 24 hour(s)).  No results found.  Assessment: 1 day history of right lower abdominal pain, started after intercourse, rates pain at a 10. Nausea with vomiting. History of ovarian cysts. Temperature 100.4 last evening. Ultrasound: Right ovarian cyst 78 x 61 mm's, questionable hemorrhagic , left ovary normal. Fluid in the cul-de-sac.   Plan: Dr. Lily PeerFernandez in to exam and  surgical removal planned. Nothing by mouth since 01/15/2015 p.m., allergies to Dilaudid, cephalexin and epinephrine.    Harrington ChallengerYOUNG,NANCY J 01/16/2015, 9:43 AM

## 2015-01-16 NOTE — Op Note (Signed)
Operative Note  01/16/2015  12:54 PM  PATIENT:  Courtney Parsons  39 y.o. female  PRE-OPERATIVE DIAGNOSIS:  Ruptured right ovarian cyst possible torsion  POST-OPERATIVE DIAGNOSIS:  Ruptured hemorrhagic right ovarian cyst, extensive pelvic adhesions  PROCEDURE:  Procedure(s): LAPAROSCOPIC right salpingo-oophorectomy / EXTENSIVE ABDOMINAL PELVIC LYSIS OF ADHESIONS   SURGEON:  Surgeon(s): Ok EdwardsJuan H Fernandez, MD Dara Lordsimothy P Fontaine, MD  ANESTHESIA:   general  FINDINGS: Extensive abdominal pelvic adhesions. Ruptured right hemorrhagic cyst. Normal-appearing left tube and ovary. Approximate 500 cc of blood were in the cul-de-sac.  DESCRIPTION OF OPERATION: The patient was taken to the operating room where she underwent general endotracheal anesthesia. Because of her allergies she was given gentamicin and clindamycin for bacterial prophylaxis. For DVT prophylaxis she had PSA stockings in place. A timeout was undertaken to properly identify the patient and voice out loud procedure to be performed. The abdomen vagina and perineum were prepped and draped in the usual sterile fashion. A Foley catheter had been inserted to monitor urinary output. After the drapes were in place and after reassuring the patient had a nasogastric tube to decompress her abdomen a small incision was made 3 cm below the left costal margin midportion and a 5 mm Optiview trocar was inserted. A pneumoperitoneum was established. Visualization of the pelvic cavity demonstrated no adhesions underneath the umbilicus that extensive pelvic adhesions. At this point a subumbilical incision was made and a 10/11 mm trocar was inserted and a third port was made under laparoscopic guidance the right lower abdomen with a 5 mm trocar. Extensive pelvic adhesions was required to free the right tube and ovary. Close attention was placed on the right ureter at all times and the adhesions were able to be freed from the ovary. The right  infundibulopelvic ligament was identified and coaptated and transected with the Harmonic scalpel. The remainder of the right tube and ovary were free from the right pelvic sidewall and then placed on an Endopouch and retrieved and passed off the operative field for histological evaluation. Additional pelvic adhesive lysis was performed on the left side to be able to visualize the left ovary and tube which appeared to be normal. Due to the extensive amount of pelvic adhesions the left salpingectomy that was planned to be removed initially prophylactically was not safely accessible. The pelvic cavity was then copiously irrigated with normal saline solution. All pedicles are dry. The pneumoperitoneum was removed and the instrument removed. The subumbilical fascia was closed with a running stitch of 0 Vicryl suture the subcuticular stitch was reapproximated with 3-0 Vicryl suture and all 3 skin incisions were reapproximated with Dermabond glue. For postoperative analgesia 0.25% Marcaine was infiltrated all 3 port sites for a total of 20 cc. Patient was given Toradol 30 mg IV in route to the recovery room after she was extubated successfully in the nasogastric tube was removed. Sponge count and needle count were correct. Blood loss was 500 cc.  ESTIMATED BLOOD LOSS: 500 cc  Preoperative hemoglobin and hematocrit 12.8 37.5 platelet count 323,000   Intake/Output Summary (Last 24 hours) at 01/16/15 1254 Last data filed at 01/16/15 1239  Gross per 24 hour  Intake   2500 ml  Output    700 ml  Net   1800 ml     BLOOD ADMINISTERED:none   LOCAL MEDICATIONS USED:  OTHER Marcaine 0.25% subcutaneous incision ports approximately 20 cc  SPECIMEN:  Source of Specimen:  Right tube and ovary  DISPOSITION OF SPECIMEN:  PATHOLOGY  COUNTS:  YES  PLAN OF CARE: Transfer to PACU  [facility] HMD12:54 PMTD@  .

## 2015-01-16 NOTE — Anesthesia Procedure Notes (Signed)
Procedure Name: Intubation Date/Time: 01/16/2015 10:52 AM Performed by: Yolonda KidaARVER, Hadassa Cermak L Pre-anesthesia Checklist: Patient identified, Emergency Drugs available, Suction available and Patient being monitored Patient Re-evaluated:Patient Re-evaluated prior to inductionOxygen Delivery Method: Circle system utilized Preoxygenation: Pre-oxygenation with 100% oxygen Intubation Type: IV induction, Rapid sequence and Cricoid Pressure applied Laryngoscope Size: Mac and 3 Grade View: Grade I Tube type: Oral Tube size: 7.0 mm Number of attempts: 1 Airway Equipment and Method: Stylet Placement Confirmation: ETT inserted through vocal cords under direct vision,  breath sounds checked- equal and bilateral,  positive ETCO2 and CO2 detector Secured at: 21 cm Tube secured with: Tape Dental Injury: Teeth and Oropharynx as per pre-operative assessment

## 2015-01-16 NOTE — H&P (Signed)
Courtney Parsons is an 39 y.o. female. SWF G1P1 Presents with extreme lower right abdominal pain for 1 day, tearful. 2011 TAH and appendectomy for abdominal pain, questionable endometriosis. History of ovarian cysts causing similar type extreme pelvic pain. Nausea, vomited 1, denies urinary symptoms or vaginal discharge but states leaked clear fluid earlier today.   Exam: Tearful, appears very uncomfortable. Abdomen tender with rebound and radiation. Speculum exam no fluid noted, wet prep negative.  Ultrasound: 81 x 61 mm right ovarian cyst questionable hemorrhagic area and left ovary normal. Fluid in the cul-de-sac  Pertinent Gynecological History: 2011 TAH for chronic abdominal pelvic pain. History of ovarian cysts with cystectomy prior to TAH.  No LMP recorded. Patient has had a hysterectomy.    Past Medical History  Diagnosis Date  . ADD (attention deficit disorder)   . Ovarian cyst     h/o    Past Surgical History  Procedure Laterality Date  . Abdominal hysterectomy    . Cesarean section    . Pelvic laparoscopy    . Anterior cervical decomp/discectomy fusion N/A 04/28/2014    Procedure: ANTERIOR CERVICAL DECOMPRESSION/DISCECTOMY FUSION 1 LEVEL Cervical six/seven; Surgeon: Carmela HurtKyle L Cabbell, MD; Location: [facility]; Service: Neurosurgery; Laterality: N/A;    Family History  Problem Relation Age of Onset  . Heart disease Mother   . Diabetes Mother   . Hypertension Mother   . Diabetes Maternal Grandmother   . Heart disease Maternal Grandfather   . Heart attack Maternal Grandfather     Social History:  reports that she has never smoked. She has never used smokeless tobacco. She reports that she does not drink alcohol or use illicit drugs.  Allergies:  Allergies  Allergen Reactions  . Dilaudid [Hydromorphone Hcl] Anaphylaxis  . Epinephrine Anaphylaxis    Difficulty breathing in the  dentist office   . Cephalexin Hives     (Not in a hospital admission)  Review of Systems  Constitutional: Negative. Negative for fever, chills, weight loss, malaise/fatigue and diaphoresis.  HENT: Negative. Negative for congestion, ear discharge, ear pain, hearing loss, nosebleeds, sore throat and tinnitus.  Eyes: Negative. Negative for blurred vision, double vision, photophobia, pain, discharge and redness.  Respiratory: Negative. Negative for stridor.  Cardiovascular: Negative.  Gastrointestinal: Positive for nausea, vomiting and abdominal pain. Negative for heartburn, diarrhea, constipation, blood in stool and melena.  Genitourinary: Negative.  Musculoskeletal: Negative.  Skin: Negative. Negative for itching and rash.  Neurological: Negative. Negative for weakness and headaches.  Endo/Heme/Allergies: Negative.  Psychiatric/Behavioral: Negative.    Blood pressure 128/80, weight 131 lb (59.421 kg). Physical Exam  Constitutional: She is oriented to person, place, and time. She appears well-developed and well-nourished. She appears distressed.  Cardiovascular: Normal rate, regular rhythm, normal heart sounds and intact distal pulses.  Respiratory: Effort normal and breath sounds normal.  GI: Soft. There is tenderness. There is rebound and guarding.  Genitourinary: Vagina normal. No vaginal discharge found.  Musculoskeletal: Normal range of motion. She exhibits no edema or tenderness.  Neurological: She is alert and oriented to person, place, and time. She has normal reflexes.  Skin: Skin is warm and dry. No rash noted. No erythema. No pallor.  Psychiatric: She has a normal mood and affect. Her behavior is normal. Judgment and thought content normal.     Lab Results Last 24 Hours    No results found for this or any previous visit (from the past 24 hour(s)).     Imaging Results (Last 48 hours)  No results found.    Assessment: 1 day history of right lower  abdominal pain, started after intercourse, rates pain at a 10. Nausea with vomiting. History of ovarian cysts. Temperature 100.4 last evening. Ultrasound: Right ovarian cyst 78 x 61 cm's questionable hemorrhagic , left ovary normal. Fluid in the cul-de-sac.   Plan: Dr. Lily Peer in to exam and surgical removal planned. Nothing by mouth since 01/15/2015 p.m., allergies to Dilaudid, cephalexin and epinephrine.    YOUNG,NANcy  01/16/2015, 9:43 AM     I have reviewed the above and examened patient. She has been counseled for: Laparoscopic right ovarian cystectomy, possible RSO, left salpingectomy, possible laparotomy.Risk from surgery discussed as follows:                        Patient was counseled as to the risk of surgery to include the following:  1. Infection (prohylactic antibiotics will be administered)  2. DVT/Pulmonary Embolism (prophylactic pneumo compression stockings will be used)  3.Trauma to internal organs requiring additional surgical procedure to repair any injury to     Internal organs requiring perhaps additional hospitalization days.  4.Hemmorhage requiring transfusion and blood products which carry risks such as             anaphylactic reaction, hepatitis and AIDS  Patient had received literature information on the procedure scheduled and all her questions were answered and fully accepts all risk.   [facility] HMD10:45 AMTD@Note :

## 2015-01-16 NOTE — Anesthesia Preprocedure Evaluation (Signed)
Anesthesia Evaluation  Patient identified by MRN, date of birth, ID band Patient awake    Reviewed: Allergy & Precautions, NPO status , Patient's Chart, lab work & pertinent test results  Airway Mallampati: I       Dental no notable dental hx. (+) Teeth Intact   Pulmonary neg pulmonary ROS,  breath sounds clear to auscultation  Pulmonary exam normal       Cardiovascular Rhythm:Regular Rate:Normal     Neuro/Psych PSYCHIATRIC DISORDERS ADDnegative neurological ROS     GI/Hepatic negative GI ROS, Neg liver ROS,   Endo/Other  negative endocrine ROS  Renal/GU negative Renal ROS  negative genitourinary   Musculoskeletal negative musculoskeletal ROS (+)   Abdominal (+)  Abdomen: tender.    Peds  Hematology negative hematology ROS (+)   Anesthesia Other Findings   Reproductive/Obstetrics Left ovarian cyst                             Anesthesia Physical Anesthesia Plan  ASA: II and emergent  Anesthesia Plan: General   Post-op Pain Management:    Induction: Intravenous, Rapid sequence and Cricoid pressure planned  Airway Management Planned: Oral ETT  Additional Equipment:   Intra-op Plan:   Post-operative Plan: Extubation in OR  Informed Consent: I have reviewed the patients History and Physical, chart, labs and discussed the procedure including the risks, benefits and alternatives for the proposed anesthesia with the patient or authorized representative who has indicated his/her understanding and acceptance.   Dental advisory given  Plan Discussed with: CRNA, Anesthesiologist and Surgeon  Anesthesia Plan Comments:         Anesthesia Quick Evaluation

## 2015-01-18 ENCOUNTER — Emergency Department (HOSPITAL_COMMUNITY): Payer: 59

## 2015-01-18 ENCOUNTER — Telehealth: Payer: Self-pay | Admitting: *Deleted

## 2015-01-18 ENCOUNTER — Emergency Department (HOSPITAL_COMMUNITY)
Admission: EM | Admit: 2015-01-18 | Discharge: 2015-01-18 | Disposition: A | Discharge status: Home / Self Care | Payer: 59 | Attending: Emergency Medicine | Admitting: Emergency Medicine

## 2015-01-18 ENCOUNTER — Encounter (HOSPITAL_COMMUNITY): Payer: Self-pay | Admitting: Gynecology

## 2015-01-18 DIAGNOSIS — G8918 Other acute postprocedural pain: Secondary | ICD-10-CM | POA: Insufficient documentation

## 2015-01-18 DIAGNOSIS — Z79899 Other long term (current) drug therapy: Secondary | ICD-10-CM | POA: Insufficient documentation

## 2015-01-18 DIAGNOSIS — R0602 Shortness of breath: Secondary | ICD-10-CM | POA: Insufficient documentation

## 2015-01-18 DIAGNOSIS — F909 Attention-deficit hyperactivity disorder, unspecified type: Secondary | ICD-10-CM | POA: Diagnosis not present

## 2015-01-18 DIAGNOSIS — Z8742 Personal history of other diseases of the female genital tract: Secondary | ICD-10-CM | POA: Diagnosis not present

## 2015-01-18 LAB — CBC WITH DIFFERENTIAL/PLATELET
BASOS ABS: 0 10*3/uL (ref 0.0–0.1)
Basophils Relative: 0 % (ref 0–1)
EOS ABS: 0.5 10*3/uL (ref 0.0–0.7)
EOS PCT: 7 % — AB (ref 0–5)
HEMATOCRIT: 35.3 % — AB (ref 36.0–46.0)
Hemoglobin: 11.6 g/dL — ABNORMAL LOW (ref 12.0–15.0)
LYMPHS ABS: 2.6 10*3/uL (ref 0.7–4.0)
Lymphocytes Relative: 32 % (ref 12–46)
MCH: 29.8 pg (ref 26.0–34.0)
MCHC: 32.9 g/dL (ref 30.0–36.0)
MCV: 90.7 fL (ref 78.0–100.0)
Monocytes Absolute: 0.7 10*3/uL (ref 0.1–1.0)
Monocytes Relative: 9 % (ref 3–12)
Neutro Abs: 4.2 10*3/uL (ref 1.7–7.7)
Neutrophils Relative %: 52 % (ref 43–77)
Platelets: 308 10*3/uL (ref 150–400)
RBC: 3.89 MIL/uL (ref 3.87–5.11)
RDW: 12.9 % (ref 11.5–15.5)
WBC: 8.2 10*3/uL (ref 4.0–10.5)

## 2015-01-18 LAB — TYPE AND SCREEN
ABO/RH(D): O POS
Antibody Screen: NEGATIVE

## 2015-01-18 LAB — LIPASE, BLOOD: Lipase: 20 U/L (ref 11–59)

## 2015-01-18 LAB — TROPONIN I
Troponin I: <0.03 ng/mL (ref <0.031)
Troponin I: <0.03 ng/mL (ref <0.031)

## 2015-01-18 LAB — COMPREHENSIVE METABOLIC PANEL
ALK PHOS: 44 U/L (ref 39–117)
ALT: 33 U/L (ref 0–35)
AST: 30 U/L (ref 0–37)
Albumin: 3.9 g/dL (ref 3.5–5.2)
Anion gap: 8 (ref 5–15)
BUN: 10 mg/dL (ref 6–23)
CO2: 29 mmol/L (ref 19–32)
Calcium: 9 mg/dL (ref 8.4–10.5)
Chloride: 100 mmol/L (ref 96–112)
Creatinine, Ser: 0.71 mg/dL (ref 0.50–1.10)
GFR calc non Af Amer: >90 mL/min (ref >90)
GLUCOSE: 97 mg/dL (ref 70–99)
POTASSIUM: 3.6 mmol/L (ref 3.5–5.1)
Sodium: 137 mmol/L (ref 135–145)
TOTAL PROTEIN: 6.9 g/dL (ref 6.0–8.3)
Total Bilirubin: 0.3 mg/dL (ref 0.3–1.2)

## 2015-01-18 LAB — BRAIN NATRIURETIC PEPTIDE: B Natriuretic Peptide: 79 pg/mL (ref 0.0–100.0)

## 2015-01-18 MED ORDER — IOHEXOL 350 MG/ML SOLN
100.0000 mL | Freq: Once | INTRAVENOUS | Status: AC | PRN
  Administered 2015-01-18: 100 mL via INTRAVENOUS

## 2015-01-18 MED ORDER — SODIUM CHLORIDE 0.9 % IJ SOLN
INTRAMUSCULAR | Status: DC
Start: 2015-01-18 — End: 2015-01-19
  Filled 2015-01-18: qty 500

## 2015-01-18 MED ORDER — MORPHINE SULFATE 4 MG/ML IJ SOLN
4.0000 mg | Freq: Once | INTRAMUSCULAR | Status: AC
  Administered 2015-01-18: 4 mg via INTRAVENOUS
  Filled 2015-01-18: qty 1

## 2015-01-18 MED ORDER — OXYCODONE-ACETAMINOPHEN 5-325 MG PO TABS: 1.0000 | ORAL_TABLET | ORAL | Status: DC | PRN

## 2015-01-18 MED ORDER — ONDANSETRON HCL 4 MG/2ML IJ SOLN
4.0000 mg | Freq: Once | INTRAMUSCULAR | Status: AC
  Administered 2015-01-18: 4 mg via INTRAVENOUS
  Filled 2015-01-18: qty 2

## 2015-01-18 MED ORDER — IOHEXOL 300 MG/ML  SOLN
50.0000 mL | Freq: Once | INTRAMUSCULAR | Status: AC | PRN
  Administered 2015-01-18: 50 mL via ORAL

## 2015-01-18 MED ORDER — KETOROLAC TROMETHAMINE 30 MG/ML IJ SOLN
30.0000 mg | Freq: Once | INTRAMUSCULAR | Status: AC
  Administered 2015-01-18: 30 mg via INTRAVENOUS
  Filled 2015-01-18: qty 1

## 2015-01-18 MED ORDER — SODIUM CHLORIDE 0.9 % IJ SOLN
INTRAMUSCULAR | Status: AC
  Filled 2015-01-18: qty 45

## 2015-01-18 MED ORDER — SODIUM CHLORIDE 0.9 % IV BOLUS (SEPSIS)
1000.0000 mL | Freq: Once | INTRAVENOUS | Status: AC
  Administered 2015-01-18: 1000 mL via INTRAVENOUS

## 2015-01-18 MED ORDER — LORAZEPAM 2 MG/ML IJ SOLN
1.0000 mg | Freq: Once | INTRAMUSCULAR | Status: AC
  Administered 2015-01-18: 1 mg via INTRAVENOUS
  Filled 2015-01-18: qty 1

## 2015-01-18 NOTE — Telephone Encounter (Signed)
Pt called post laparoscopic ovarian cystectomy on 01/16/15 c/o swollen legs and thighs that are warm to touch, just noticed now. Pt said her legs are normal color, warm to touch, no fever, no pain from surgery. States her gas is still the same as it was when speak with Dr.Fernadez yesterday. She did not state she was in pain, just worried about her swollen thigh/legs, tearful.  I advised pt to go to ER as Dr.Fernandez has left for the day and the ER could check a doppler if needed. Pt said her boyfriend was on his way home and he will drive her to the hospital. I will route Dr.Fernandez to be aware of this as well.

## 2015-01-18 NOTE — ED Notes (Signed)
LUQ pain and left shoulder pain since yesterday.   Leg swelling since yesterday.  [facility]

## 2015-01-18 NOTE — ED Notes (Signed)
Pt states she had right oophorectomy on Tuesday. Pt states she had a ruptured ovarian cyst which caused internal  Bleeding, stating she was rushed to surgery. Lap procedure was performed with 4 small incision sites (2 to left abd, one to umbilicus, and one to the right abd), bruising to umbilical area as well. Pt presented with severe pain to her LUQ, left shoulder and chest. Stated stabbing pain. Pain became worse this morning and progressed throughout the day.

## 2015-01-18 NOTE — Telephone Encounter (Signed)
Pt left message in voicemail to call her, I called and left voicemail for pt to call.

## 2015-01-18 NOTE — Discharge Instructions (Signed)
Pain Relief Preoperatively and Postoperatively Stop taking Demerol. Take Percocet for pain instead. Do not take both. Follow up with Dr. Lily PeerFernandez tomorrow. Call him if he does not call you. Return to the ED if you develop new or worsening symptoms. Being a good patient does not mean being a silent one.If you have questions, problems, or concerns about the pain you may feel after surgery, let your caregiver know.Patients have the right to assessment and management of pain. The treatment of pain after surgery is important to speed up recovery and return to normal activities. Severe pain after surgery, and the fear or anxiety associated with that pain, may cause extreme discomfort that:  Prevents sleep.  Decreases the ability to breathe deeply and cough. This can cause pneumonia or other upper airway infections.  Causes your heart to beat faster and your blood pressure to be higher.  Increases the risk for constipation and bloating.  Decreases the ability of wounds to heal.  May result in depression, increased anxiety, and feelings of helplessness. Relief of pain before surgery is also important because it will lessen the pain after surgery. Patients who receive both pain relief before and after surgery experience greater pain relief than those who only receive pain relief after surgery. Let your caregiver know if you are having uncontrolled pain.This is very important.Pain after surgery is more difficult to manage if it is permitted to become severe, so prompt and adequate treatment of acute pain is necessary. PAIN CONTROL METHODS Your caregivers follow policies and procedures about the management of patient pain.These guidelines should be explained to you before surgery.Plans for pain control after surgery must be mutually decided upon and instituted with your full understanding and agreement.Do not be afraid to ask questions regarding the care you are receiving.There are many different ways  your caregivers will attempt to control your pain, including the following methods. As needed pain control  You may be given pain medicine either through your intravenous (IV) tube, or as a pill or liquid you can swallow. You will need to let your caregiver know when you are having pain. Then, your caregiver will give you the pain medicine ordered for you.  Your pain medicine may make you constipated. If constipation occurs, drink more liquids if you can. Your caregiver may have you take a mild laxative. IV patient-controlled analgesia pump (PCA pump)  You can get your pain medicine through the IV tube which goes into your vein. You are able to control the amount of pain medicine that you get. The pain medicine flows in through an IV tube and is controlled by a pump. This pump gives you a set amount of pain medicine when you push the button hooked up to it. Nobody should push this button but you or someone specifically assigned by you to do so. It is set up to keep you from accidentally giving yourself too much pain medicine. You will be able to start using your pain pump in the recovery room after your surgery. This method can be helpful for most types of surgery.  If you are still having too much pain, tell your caregiver. Also, tell your caregiver if you are feeling too sleepy or nauseous. Continuous epidural pain control  A thin, soft tube (catheter) is put into your back. Pain medicine flows through the catheter to lessen pain in the part of your body where the surgery is done. Continuous epidural pain control may work best for you if you are having surgery  on your chest, abdomen, hip area, or legs. The epidural catheter is usually put into your back just before surgery. The catheter is left in until you can eat and take medicine by mouth. In most cases, this may take 2 to 3 days.  Giving pain medicine through the epidural catheter may help you heal faster because:  Your bowel gets back to  normal faster.  You can get back to eating sooner.  You can be up and walking sooner. Medicine that numbs the area (local anesthetic)  You may receive an injection of pain medicine near where the pain is (local infiltration).  You may receive an injection of pain medicine near the nerve that controls the sensation to a specific part of the body (peripheral nerve block).  Medicine may be put in the spine to block pain (spinal block). Opioids  Moderate to moderately severe acute pain after surgery may respond to opioids.Opioids are narcotic pain medicine. Opioids are often combined with non-narcotic medicines to improve pain relief, diminish the risk of side effects, and reduce the chance of addiction.  If you follow your caregiver's directions about taking opioids and you do not have a history of substance abuse, your risk of becoming addicted is exceptionally small.Opioids are given for short periods of time in careful doses to prevent addiction. Other methods of pain control include:  Steroids.  Physical therapy.  Heat and cold therapy.  Compression, such as wrapping an elastic bandage around the area of pain.  Massage. These various ways of controlling pain may be used together. Combining different methods of pain control is called multimodal analgesia. Using this approach has many benefits, including being able to eat, move around, and leave the hospital sooner. Document Released: 12/06/2002 Document Revised: 12/08/2011 Document Reviewed: 12/10/2010 [facility] Patient Information 2015 [place], [place]. This information is not intended to replace advice given to you by your health care provider. Make sure you discuss any questions you have with your health care provider.

## 2015-01-18 NOTE — ED Provider Notes (Signed)
CSN: [id number]     Arrival date & time 01/18/15  1741 History   First MD Initiated Contact with Patient 01/18/15 1800     Chief Complaint  Patient presents with  . Post-op Problem     (Consider location/radiation/quality/duration/timing/severity/associated sxs/prior Treatment) HPI Comments: Level V caveat for acuity of condition. Patient presents with severe left sided rib and upper abdominal pain that is constant and causing her shortness of breath. She feels that her legs are swollen. She had surgery at [facility] 2 days ago for a hemorrhagic ovarian cyst she had a unilateral salpingectomy on the right by Dr. Lily Peer. She thinks her pain in the left side of her chest and abdomen have been ongoing since then. She denies any cardiac or pulmonary history. She denies any history of asthma. She states that she cannot breathe and has a sit upright to breathe properly. Her oxygenation is 100%. Her pulse is 65.  The history is provided by the patient and the spouse. The history is limited by the condition of the patient.    Past Medical History  Diagnosis Date  . ADD (attention deficit disorder)   . Ovarian cyst     h/o   Past Surgical History  Procedure Laterality Date  . Abdominal hysterectomy    . Cesarean section    . Pelvic laparoscopy    . Anterior cervical decomp/discectomy fusion N/A 04/28/2014    Procedure: ANTERIOR CERVICAL DECOMPRESSION/DISCECTOMY FUSION 1 LEVEL Cervical six/seven;  Surgeon: Carmela Hurt, MD;  Location: [facility];  Service: Neurosurgery;  Laterality: N/A;  . Laparoscopic ovarian cystectomy Right 01/16/2015    Procedure: LAPAROSCOPIC OVARIAN CYSTECTOMY / EXTENSIVE ABDOMINAL LYSIS OF ADHESIONS;  Surgeon: Ok Edwards, MD;  Location: [facility];  Service: Gynecology;  Laterality: Right;  . Laparoscopic unilateral salpingectomy Right 01/16/2015    Procedure: LAPAROSCOPIC UNILATERAL SALPINGECTOMY;  Surgeon: Ok Edwards, MD;  Location: [facility];  Service:  Gynecology;  Laterality: Right;   Family History  Problem Relation Age of Onset  . Heart disease Mother   . Diabetes Mother   . Hypertension Mother   . Diabetes Maternal Grandmother   . Heart disease Maternal Grandfather   . Heart attack Maternal Grandfather    History  Substance Use Topics  . Smoking status: Never Smoker   . Smokeless tobacco: Never Used  . Alcohol Use: No   OB History    Gravida Para Term Preterm AB TAB SAB Ectopic Multiple Living   Review of Systems  HENT: Negative for congestion and rhinorrhea.   Respiratory: Positive for shortness of breath.   Cardiovascular: Positive for chest pain and leg swelling.  Gastrointestinal: Positive for abdominal pain. Negative for nausea and vomiting.  Genitourinary: Negative for dysuria, hematuria and vaginal bleeding.  Musculoskeletal: Positive for back pain. Negative for myalgias and arthralgias.  Skin: Negative for rash.  Neurological: Negative for dizziness, facial asymmetry and headaches.  A complete 10 system review of systems was obtained and all systems are negative except as noted in the HPI and PMH.      Allergies  Dilaudid; Epinephrine; and Cephalexin  Home Medications   Prior to Admission medications   Medication Sig Start Date End Date Taking? Authorizing Provider  amphetamine-dextroamphetamine (ADDERALL XR) 20 MG 24 hr capsule Take 20 mg by mouth daily. At 6 am   Yes Historical Provider, MD  amphetamine-dextroamphetamine (ADDERALL) 20 MG tablet Take 20  mg by mouth daily. Takes in the afternoon   Yes Historical Provider, MD  ibuprofen (ADVIL,MOTRIN) 200 MG tablet Take 600-800 mg by mouth every 6 (six) hours as needed for moderate pain.   Yes Historical Provider, MD  meperidine (DEMEROL) 50 MG tablet Take 1 tablet (50 mg total) by mouth every 4 (four) hours as needed for severe pain. 01/16/15  Yes Ok Edwards, MD  ondansetron (ZOFRAN) 4 MG tablet Take 4 mg by mouth every 8 (eight)  hours as needed for nausea or vomiting.   Yes Historical Provider, MD  oxyCODONE-acetaminophen (PERCOCET/ROXICET) 5-325 MG per tablet Take 1 tablet by mouth every 4 (four) hours as needed for severe pain. 01/18/15   Glynn Octave, MD   BP 97/62 mmHg  Pulse 60  Temp(Src) 98.2 F (36.8 C) (Oral)  Resp 16  Ht 5' (1.524 m)  Wt 130 lb (58.968 kg)  BMI 25.39 kg/m2  SpO2 99% Physical Exam  Constitutional: She is oriented to person, place, and time. She appears well-developed and well-nourished. She appears distressed.  Anxious and tearful  HENT:  Head: Normocephalic and atraumatic.  Mouth/Throat: Oropharynx is clear and moist. No oropharyngeal exudate.  Eyes: Conjunctivae and EOM are normal. Pupils are equal, round, and reactive to light.  Neck: Normal range of motion. Neck supple.  No meningismus.  Cardiovascular: Normal rate, regular rhythm, normal heart sounds and intact distal pulses.   No murmur heard. Pulmonary/Chest: Effort normal and breath sounds normal. No respiratory distress. She exhibits tenderness.  Tenderness to left lateral lower ribs and left upper quadrant. No ecchymosis.  Abdominal: Soft. There is tenderness. There is no rebound and no guarding.  Lower abdominal tenderness and ecchymosis at surgical sites.  Musculoskeletal: Normal range of motion. She exhibits no edema or tenderness.  Neurological: She is alert and oriented to person, place, and time. No cranial nerve deficit. She exhibits normal muscle tone. Coordination normal.  No ataxia on finger to nose bilaterally. No pronator drift. 5/5 strength throughout. CN 2-12 intact. Negative Romberg. Equal grip strength. Sensation intact. Gait is normal.   Skin: Skin is warm.  Psychiatric: She has a normal mood and affect. Her behavior is normal.  Nursing note and vitals reviewed.   ED Course  Procedures (including critical care time) Labs Review Labs Reviewed  CBC WITH DIFFERENTIAL/PLATELET - Abnormal; Notable for  the following:    Hemoglobin 11.6 (*)    HCT 35.3 (*)    Eosinophils Relative 7 (*)    All other components within normal limits  TROPONIN I  COMPREHENSIVE METABOLIC PANEL  LIPASE, BLOOD  BRAIN NATRIURETIC PEPTIDE  TROPONIN I  TYPE AND SCREEN    Imaging Review Ct Angio Chest Pe W/cm &/or Wo Cm  01/18/2015   CLINICAL DATA:  Severe left upper quadrant pain radiating to the shoulder and chest. Right oophorectomy 2 days ago.  EXAM: CT ANGIOGRAM OF THE CHEST  AND CT ABDOMEN WITH CONTRAST  TECHNIQUE: Multidetector CT imaging of the chest and abdomen was performed following the standard protocol during bolus administration of intravenous contrast.  CONTRAST:  50mL OMNIPAQUE IOHEXOL 300 MG/ML SOLN, OMNIPAQUE IOHEXOL 350 MG/ML SOLN  COMPARISON:  CT abdomen dated 01/29/2011  FINDINGS: CT ANGIOGRAM OF THE CHEST FINDINGS  There are no infiltrates, effusions, masses, adenopathy, pulmonary emboli, or other significant abnormalities. Heart size is normal. No osseous abnormality.  CT ABDOMEN FINDINGS  There is hepatomegaly with a prominent left lobe this extends superior lateral to the spleen. No focal lesions.  Biliary tree, spleen, pancreas, adrenal glands, and kidneys are normal. The bowel is normal. Appendix is been removed. Uterus and right ovary have been removed. Left ovary appears normal. Bladder is distended.  There is air in the anterior abdominal wall and in the subcutaneous fat in the left mid abdomen. There is also slight edema in the subcutaneous fat of the pelvis anteriorly and posteriorly. This is most likely postsurgical in origin.  IMPRESSION: 1.  No acute intra-abdominal abnormality. 2. Air and edema in the anterior abdominal wall and subcutaneous fat to the left of midline in the pelvis and abdomen. This is felt to be postsurgical in origin. 3. Hepatomegaly.   Electronically Signed   By: Francene BoyersJames  Maxwell M.D.   On: 01/18/2015 19:51   Ct Abdomen Pelvis W Contrast  01/18/2015   CLINICAL DATA:   Severe left upper quadrant pain radiating to the shoulder and chest. Right oophorectomy 2 days ago.  EXAM: CT ANGIOGRAM OF THE CHEST  AND CT ABDOMEN WITH CONTRAST  TECHNIQUE: Multidetector CT imaging of the chest and abdomen was performed following the standard protocol during bolus administration of intravenous contrast.  CONTRAST:  50mL OMNIPAQUE IOHEXOL 300 MG/ML SOLN, 100mL OMNIPAQUE IOHEXOL 350 MG/ML SOLN  COMPARISON:  CT abdomen dated 01/29/2011  FINDINGS: CT ANGIOGRAM OF THE CHEST FINDINGS  There are no infiltrates, effusions, masses, adenopathy, pulmonary emboli, or other significant abnormalities. Heart size is normal. No osseous abnormality.  CT ABDOMEN FINDINGS  There is hepatomegaly with a prominent left lobe this extends superior lateral to the spleen. No focal lesions.  Biliary tree, spleen, pancreas, adrenal glands, and kidneys are normal. The bowel is normal. Appendix is been removed. Uterus and right ovary have been removed. Left ovary appears normal. Bladder is distended.  There is air in the anterior abdominal wall and in the subcutaneous fat in the left mid abdomen. There is also slight edema in the subcutaneous fat of the pelvis anteriorly and posteriorly. This is most likely postsurgical in origin.  IMPRESSION: 1.  No acute intra-abdominal abnormality. 2. Air and edema in the anterior abdominal wall and subcutaneous fat to the left of midline in the pelvis and abdomen. This is felt to be postsurgical in origin. 3. Hepatomegaly.   Electronically Signed   By: Francene BoyersJames  Maxwell M.D.   On: 01/18/2015 19:51   Dg Chest Portable 1 View  01/18/2015   CLINICAL DATA:  Shortness of breath and left lower quadrant pain as well as pain between the shoulder blades since yesterday. Pelvic surgery 2 days ago.  EXAM: PORTABLE CHEST - 1 VIEW  COMPARISON:  09/08/2011  FINDINGS: The heart size and mediastinal contours are within normal limits. Both lungs are clear. The visualized skeletal structures are  unremarkable.  IMPRESSION: No active disease.   Electronically Signed   By: Elberta Fortisaniel  Boyle M.D.   On: 01/18/2015 18:46     EKG Interpretation   Date/Time:  Thursday January 18 2015 18:06:54 EDT Ventricular Rate:  62 PR Interval:  127 QRS Duration: 83 QT Interval:  411 QTC Calculation: 417 R Axis:   55 Text Interpretation:  Sinus rhythm No previous ECGs available Confirmed by  Lyniah Fujita  MD, Lakya Schrupp [phone]([id number]) on 01/18/2015 6:36:02 PM      MDM   Final diagnoses:  Postoperative pain   Chest pain shortness of breath after surgery 2 days ago. Patient had right nephrectomy for hemorrhagic cyst and ovarian torsion. Patient feels legs are swollen but is not evident. She complains of being short  of breath though her oxygenation is 100%. EKG normal sinus rhythm. Chest x-ray, rule out PE.  Hemoglobin 1 g lower than 2 days ago. Chest x-ray negative.  CT scans are reassuring. No PE. There is air in the abdominal wall consistent with surgery. No intra-abdominal pathology.  Case discussed extensively with Dr. Hyacinth Meeker covering for Dr. Lily Peer. She was present for the patient's surgery and states that it is not unusual to have air in the abdominal wall. She feels patient's pain is appropriate given her recent laparoscopy. She states patient can be seen in the office tomorrow.  Pain is improved. Vitals are stable. Troponin negative 2. Discussed with patient to stop Demerol and use Percocet instead for pain. Dr. Lily Peer will see her in the office tomorrow. Return precautions discussed.  BP 97/62 mmHg  Pulse 60  Temp(Src) 98.2 F (36.8 C) (Oral)  Resp 16  Ht 5' (1.524 m)  Wt 130 lb (58.968 kg)  BMI 25.39 kg/m2  SpO2 99%   Glynn Octave, MD 01/19/15 [phone]

## 2015-01-21 ENCOUNTER — Telehealth: Payer: Self-pay | Admitting: Gynecology

## 2015-01-21 NOTE — Telephone Encounter (Signed)
On Call Note 12:30 AM:  Legs swollen and pain into right shoulder with difficulty breathing.  Gets better with resolution of the shoulder pain and difficulty breathing with percocet but returns when wears off.  Similar symptoms 2 days ago and was evaluated in ER with negative abd/pelvic CT and neg chest CT r/o pulm embolus.  CBC with stable Hbg and normal WBC.  Walked "a lot" today, eating, voiding, BM's daily with flatus, no N/V or fevers/chills.  Not having significant abd pain.  Options for ER eval now vs rest, monitor ER eval if continues or worsens.  OV Monday if better but not revolved.  Pt prefers to monitor at present and will follow up as above as needed.

## 2015-02-01 ENCOUNTER — Encounter: Payer: Self-pay | Admitting: Gynecology

## 2015-02-01 ENCOUNTER — Ambulatory Visit (INDEPENDENT_AMBULATORY_CARE_PROVIDER_SITE_OTHER): Payer: 59 | Admitting: Gynecology

## 2015-02-01 VITALS — BP 110/80 | Ht 60.0 in | Wt 131.0 lb

## 2015-02-01 DIAGNOSIS — D508 Other iron deficiency anemias: Secondary | ICD-10-CM

## 2015-02-01 DIAGNOSIS — Z09 Encounter for follow-up examination after completed treatment for conditions other than malignant neoplasm: Secondary | ICD-10-CM

## 2015-02-01 LAB — CBC WITH DIFFERENTIAL/PLATELET
BASOS PCT: 1 % (ref 0–1)
Basophils Absolute: 0.1 10*3/uL (ref 0.0–0.1)
Eosinophils Absolute: 0.4 10*3/uL (ref 0.0–0.7)
Eosinophils Relative: 6 % — ABNORMAL HIGH (ref 0–5)
HCT: 39 % (ref 36.0–46.0)
HEMOGLOBIN: 12.6 g/dL (ref 12.0–15.0)
LYMPHS PCT: 31 % (ref 12–46)
Lymphs Abs: 2.2 10*3/uL (ref 0.7–4.0)
MCH: 28.7 pg (ref 26.0–34.0)
MCHC: 32.3 g/dL (ref 30.0–36.0)
MCV: 88.8 fL (ref 78.0–100.0)
MONO ABS: 0.4 10*3/uL (ref 0.1–1.0)
MONOS PCT: 6 % (ref 3–12)
MPV: 8.8 fL (ref 8.6–12.4)
Neutro Abs: 4 10*3/uL (ref 1.7–7.7)
Neutrophils Relative %: 56 % (ref 43–77)
Platelets: 418 10*3/uL — ABNORMAL HIGH (ref 150–400)
RBC: 4.39 MIL/uL (ref 3.87–5.11)
RDW: 13.5 % (ref 11.5–15.5)
WBC: 7.2 10*3/uL (ref 4.0–10.5)

## 2015-02-01 NOTE — Progress Notes (Signed)
   Patient 39 year old who presented to the office today for her 3 week postop visit. Patient status post emergency laparoscopic right salpingo-oophorectomy along with extensive abdominal pelvic lysis of adhesions as a result of a ruptured right ovarian cyst. 2 days after surgery patient had presented to the emergency room with severe left sided rib and upper abdominal pain that is constant and causing her shortness of breath. She feels that her legs are swollen. A CT scan was reassuring no evidence of pulmonary embolism there was hair and the abdominal wall consistent with laparoscopic pneumoperitoneum few days prior. She had a normal EKG normal sinus rhythm. Her O2 sats 100% and chest x-ray was normal. Hemoglobin was 1 g lower than prior to her surgery. Patient was reassured and released home. I've no troponin was negative 2. Patient was asymptomatic today.  Findings from surgery as well as pictures were shown to the patient and pathology report as follows were shared with the patient:  FINDINGS: Extensive abdominal pelvic adhesions. Ruptured right hemorrhagic cyst. Normal-appearing left tube and ovary. Approximate 500 cc of blood were in the cul-de-sac.  PROCEDURE: Procedure(s): LAPAROSCOPIC right salpingo-oophorectomy / EXTENSIVE ABDOMINAL PELVIC LYSIS OF ADHESIONS  Diagnosis Ovary and fallopian tube, right - RIGHT OVARY: SEROSAL FIBROUS ADHESIONS AND HEMORRHAGIC BENIGN FOLLICULAR CYST. - NO ENDOMETRIOSIS OR MALIGNANCY. - RIGHT FALLOPIAN TUBE: UNREMARKABLE.  Exam: Blood pressure 110/80 Gen. appearance well-developed well-nourished in no acute distress Abdomen: Soft nontender no rebound or guarding port sites completely healed Pelvic: Bartholin urethra Skene was within normal limits Vagina: Vaginal cuff intact nontender Bimanual exam no palpable mass or tenderness Rectal exam: Not done  Assessment/plan: Patient status post 3 weeks ago having undergone emergency laparoscopy for ruptured  right hemorrhagic cyst had a right salpingo-oophorectomy as well as extensive abdominal pelvic lysis of adhesions. Patient may resume full normal activity and she scheduled to return next year for annual exam or when necessary. CBC was ordered as a result of her mild anemia.

## 2015-02-08 ENCOUNTER — Telehealth: Payer: Self-pay

## 2015-02-08 NOTE — Telephone Encounter (Signed)
Left message for patient to call me.  I have received disability [place] for [place] from [employer] but need a signed medical records release form from her before I can send this info to them.

## 2015-02-08 NOTE — Telephone Encounter (Signed)
Patient called me and provided return to work date 02/05/15 and provided a fax #.  I faxed medical records release form to her for her to fill out and send back to me.

## 2015-02-08 NOTE — Telephone Encounter (Signed)
I contacted patient because I noticed that in [employer] there was a copy of a BCBS card that was not her name and did not show any dependents. She previously had [employer] and still works for [employer] so I called her to verify that this card was hers.  She said she does not have BCBS and that it is not her card. I called [facility] and spoke with Britta MccreedyBarbara in preadmitting. She looked it up and said card was scanned in at [facility] but policy numbers on file are correct for her [employer] policy and visit was filed to her [employer].  Laurel DimmerKim H. Deleted the incorrect card [facility]. Patient was notified that it had been corrected and all charges should be filed appropriately.

## 2015-06-14 ENCOUNTER — Encounter (HOSPITAL_COMMUNITY): Payer: Self-pay | Admitting: Obstetrics and Gynecology

## 2015-06-14 ENCOUNTER — Other Ambulatory Visit: Payer: 59

## 2015-06-14 ENCOUNTER — Encounter (HOSPITAL_COMMUNITY)
Admission: AD | Disposition: A | Discharge status: Home / Self Care | Payer: Self-pay | Source: Ambulatory Visit | Attending: Gynecology

## 2015-06-14 ENCOUNTER — Ambulatory Visit: Payer: 59 | Admitting: Women's Health

## 2015-06-14 ENCOUNTER — Inpatient Hospital Stay (HOSPITAL_COMMUNITY): Payer: 59

## 2015-06-14 ENCOUNTER — Inpatient Hospital Stay (HOSPITAL_COMMUNITY)
Admission: AD | Admit: 2015-06-14 | Discharge: 2015-06-14 | Disposition: A | Discharge status: Home / Self Care | Payer: 59 | Source: Ambulatory Visit | Attending: Gynecology | Admitting: Gynecology

## 2015-06-14 DIAGNOSIS — Z90710 Acquired absence of both cervix and uterus: Secondary | ICD-10-CM | POA: Diagnosis not present

## 2015-06-14 DIAGNOSIS — R109 Unspecified abdominal pain: Secondary | ICD-10-CM | POA: Diagnosis present

## 2015-06-14 DIAGNOSIS — N8320 Unspecified ovarian cysts: Secondary | ICD-10-CM | POA: Diagnosis not present

## 2015-06-14 DIAGNOSIS — N83209 Unspecified ovarian cyst, unspecified side: Secondary | ICD-10-CM

## 2015-06-14 LAB — COMPREHENSIVE METABOLIC PANEL
ALBUMIN: 5 g/dL (ref 3.5–5.0)
ALT: 12 U/L — ABNORMAL LOW (ref 14–54)
ANION GAP: 7 (ref 5–15)
AST: 17 U/L (ref 15–41)
Alkaline Phosphatase: 46 U/L (ref 38–126)
BILIRUBIN TOTAL: 1.1 mg/dL (ref 0.3–1.2)
BUN: 10 mg/dL (ref 6–20)
CALCIUM: 9.4 mg/dL (ref 8.9–10.3)
CO2: 25 mmol/L (ref 22–32)
Chloride: 102 mmol/L (ref 101–111)
Creatinine, Ser: 0.85 mg/dL (ref 0.44–1.00)
GFR calc non Af Amer: >60 mL/min (ref >60)
GLUCOSE: 101 mg/dL — AB (ref 65–99)
POTASSIUM: 4.2 mmol/L (ref 3.5–5.1)
Sodium: 134 mmol/L — ABNORMAL LOW (ref 135–145)
Total Protein: 7.9 g/dL (ref 6.5–8.1)

## 2015-06-14 LAB — CBC
HCT: 41.5 % (ref 36.0–46.0)
Hemoglobin: 14.3 g/dL (ref 12.0–15.0)
MCH: 30.3 pg (ref 26.0–34.0)
MCHC: 34.5 g/dL (ref 30.0–36.0)
MCV: 87.9 fL (ref 78.0–100.0)
Platelets: 354 10*3/uL (ref 150–400)
RBC: 4.72 MIL/uL (ref 3.87–5.11)
RDW: 12.9 % (ref 11.5–15.5)
WBC: 10.6 10*3/uL — ABNORMAL HIGH (ref 4.0–10.5)

## 2015-06-14 SURGERY — LAPAROSCOPY OPERATIVE
Anesthesia: General

## 2015-06-14 MED ORDER — OXYCODONE HCL 5 MG PO TABS: 5.0000 mg | ORAL_TABLET | ORAL | Status: DC | PRN

## 2015-06-14 MED ORDER — OXYCODONE HCL 5 MG PO TABS
10.0000 mg | ORAL_TABLET | Freq: Once | ORAL | Status: AC
Start: 2015-06-14 — End: 2015-06-14
  Administered 2015-06-14: 10 mg via ORAL
  Filled 2015-06-14: qty 2

## 2015-06-14 MED ORDER — KETOROLAC TROMETHAMINE 30 MG/ML IJ SOLN
30.0000 mg | Freq: Once | INTRAMUSCULAR | Status: AC
  Administered 2015-06-14: 30 mg via INTRAVENOUS
  Filled 2015-06-14: qty 1

## 2015-06-14 MED ORDER — ONDANSETRON HCL 4 MG/2ML IJ SOLN
4.0000 mg | Freq: Once | INTRAMUSCULAR | Status: AC
  Administered 2015-06-14: 4 mg via INTRAVENOUS
  Filled 2015-06-14: qty 2

## 2015-06-14 MED ORDER — IBUPROFEN 800 MG PO TABS: 800.0000 mg | ORAL_TABLET | Freq: Three times a day (TID) | ORAL | Status: AC

## 2015-06-14 MED ORDER — LACTATED RINGERS IV SOLN
INTRAVENOUS | Status: DC
  Administered 2015-06-14 (×2): via INTRAVENOUS

## 2015-06-14 MED ORDER — DOCUSATE SODIUM 100 MG PO CAPS: 100.0000 mg | ORAL_CAPSULE | Freq: Two times a day (BID) | ORAL | Status: DC

## 2015-06-14 MED ORDER — MORPHINE SULFATE (PF) 4 MG/ML IV SOLN
2.0000 mg | Freq: Once | INTRAVENOUS | Status: AC
  Administered 2015-06-14: 2 mg via INTRAVENOUS
  Filled 2015-06-14: qty 1

## 2015-06-14 MED ORDER — KETOROLAC TROMETHAMINE 30 MG/ML IJ SOLN
30.0000 mg | Freq: Once | INTRAMUSCULAR | Status: AC
  Administered 2015-06-14: 30 mg via INTRAMUSCULAR
  Filled 2015-06-14: qty 1

## 2015-06-14 NOTE — MAU Note (Signed)
Pt. Started to have pain in her lower abdomen yesterday and diarrhea. States this happened the last time she had a hemorraghic cyst and surgery in April. At [facility] pain became very bad and took  Ibuprofen at 10am.

## 2015-06-14 NOTE — MAU Provider Note (Signed)
History     CSN: [id number]  Arrival date and time: 06/14/15 1245   None     No chief complaint on file.  HPI  Ms.NOTNAMED CROUCHER is a 39 y.o. female [id number] with a history of a partial hysterectomy and recent salpingectomy presents with acute onset of abdominal pain. The patient was at work and developed severe pain in her lower abdomen along with nausea and chills; the pain is worse in the center of her abdomen. The patient developed similar pain in April and [place] found a large hemorrhagic cyst and the patient underwent a laparoscopic right salpingectomy.   The patient says the pain is 10/10; stabbing and constant. 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    OB History    Gravida Para Term Preterm AB TAB SAB Ectopic Multiple Living   1 1 1       1       Past Medical History  Diagnosis Date  . ADD (attention deficit disorder)   . Ovarian cyst     h/o    Past Surgical History  Procedure Laterality Date  . Abdominal hysterectomy    . Cesarean section    . Pelvic laparoscopy    . Anterior cervical decomp/discectomy fusion N/A 04/28/2014    Procedure: ANTERIOR CERVICAL DECOMPRESSION/DISCECTOMY FUSION 1 LEVEL Cervical six/seven;  Surgeon: Winfield Cunas, MD;  Location: [facility];  Service: Neurosurgery;  Laterality: N/A;  . Laparoscopic ovarian cystectomy Right 01/16/2015    Procedure: LAPAROSCOPIC OVARIAN CYSTECTOMY / EXTENSIVE ABDOMINAL LYSIS OF ADHESIONS;  Surgeon: Terrance Mass, MD;  Location: [facility];  Service: Gynecology;  Laterality: Right;  . Laparoscopic unilateral salpingectomy Right 01/16/2015    Procedure: LAPAROSCOPIC UNILATERAL SALPINGECTOMY;  Surgeon: Terrance Mass, MD;  Location: [facility];  Service: Gynecology;  Laterality: Right;    Family History  Problem Relation Age of Onset  . Heart disease Mother   . Diabetes Mother   . Hypertension Mother   . Diabetes Maternal Grandmother   . Heart disease Maternal Grandfather   . Heart attack  Maternal Grandfather     Social History  Substance Use Topics  . Smoking status: Never Smoker   . Smokeless tobacco: Never Used  . Alcohol Use: No    Allergies:  Allergies  Allergen Reactions  . Dilaudid [Hydromorphone Hcl] Anaphylaxis  . Epinephrine Anaphylaxis    Difficulty breathing in the dentist office   . Cephalexin Hives    Prescriptions prior to admission  Medication Sig Dispense Refill Last Dose  . amphetamine-dextroamphetamine (ADDERALL XR) 20 MG 24 hr capsule Take 20 mg by mouth daily. At 6 am   Taking  . amphetamine-dextroamphetamine (ADDERALL) 20 MG tablet Take 20 mg by mouth daily. Takes in the afternoon   Taking  . ibuprofen (ADVIL,MOTRIN) 200 MG tablet Take 600-800 mg by mouth every 6 (six) hours as needed for moderate pain.   Taking   Results for orders placed or performed during the hospital encounter of 06/14/15 (from the past 48 hour(s))  CBC     Status: Abnormal   Collection Time: 06/14/15 12:50 PM  Result Value Ref Range   WBC 10.6 (H) 4.0 - 10.5 K/uL   RBC 4.72 3.87 - 5.11 MIL/uL   Hemoglobin 14.3 12.0 - 15.0 g/dL   HCT 41.5 36.0 - 46.0 %   MCV 87.9 78.0 - 100.0 fL   MCH 30.3 26.0 - 34.0 pg   MCHC 34.5 30.0 - 36.0 g/dL  RDW 12.9 11.5 - 15.5 %   Platelets 354 150 - 400 K/uL  Comprehensive metabolic panel     Status: Abnormal   Collection Time: 06/14/15 12:50 PM  Result Value Ref Range   Sodium 134 (L) 135 - 145 mmol/L   Potassium 4.2 3.5 - 5.1 mmol/L   Chloride 102 101 - 111 mmol/L   CO2 25 22 - 32 mmol/L   Glucose, Bld 101 (H) 65 - 99 mg/dL   BUN 10 6 - 20 mg/dL   Creatinine, Ser 0.85 0.44 - 1.00 mg/dL   Calcium 9.4 8.9 - 10.3 mg/dL   Total Protein 7.9 6.5 - 8.1 g/dL   Albumin 5.0 3.5 - 5.0 g/dL   AST 17 15 - 41 U/L   ALT 12 (L) 14 - 54 U/L   Alkaline Phosphatase 46 38 - 126 U/L   Total Bilirubin 1.1 0.3 - 1.2 mg/dL   GFR calc non Af Amer >60 >60 mL/min   GFR calc Af Amer >60 >60 mL/min    Comment: (NOTE) The eGFR has been calculated  using the CKD EPI equation. This calculation has not been validated in all clinical situations. eGFR's persistently <60 mL/min signify possible Chronic Kidney Disease.    Anion gap 7 5 - 15   US Transvaginal Non-ob  06/14/2015   CLINICAL DATA:  Severe lower abdominal pain for 2 days. Prior hysterectomy, right oophorectomy, right salpingectomy. History of endometriosis and lysis of adhesions.  EXAM: TRANSVAGINAL ULTRASOUND OF PELVIS  DOPPLER ULTRASOUND OF OVARIES  TECHNIQUE: Transvaginal ultrasound examination of the pelvis was performed including evaluation of the uterus, ovaries, adnexal regions, and pelvic cul-de-sac.  Color and duplex Doppler ultrasound was utilized to evaluate blood flow to the ovaries.  COMPARISON:  CT of the abdomen and pelvis 01/18/2015, pelvic ultrasound 01/16/2015 performed at [facility] gynecology.  FINDINGS: Uterus  Measurements: Surgically absent. Vaginal cuff has a normal appearance.  Endometrium  Thickness: Surgically absent uterus.  Right ovary  Measurements: Surgically absent.  No right adnexal mass.  Left ovary  Measurements: 5.4 x 5.2 x 5.1 cm. Within the left ovary there is a septated cystic lesion measuring 4.7 x 3.8 x 4.1 cm. Septations are avascular. There is color flow within the ovarian parenchyma.  Pulsed Doppler evaluation demonstrates normal low-resistance arterial and venous waveforms in the left ovary.  IMPRESSION: 1. Complex cystic lesion within the left ovary. Findings favor hemorrhagic cyst. 2. Given the history of pain, follow-up is recommended. Pelvic ultrasound is suggested in 8-12 weeks. 3. Status post hysterectomy and right oophorectomy.   Electronically Signed   By: Nolon Nations M.D.   On: 06/14/2015 14:31   [place] Art/ven Flow Abd Pelv Doppler  06/14/2015   CLINICAL DATA:  Severe lower abdominal pain for 2 days. Prior hysterectomy, right oophorectomy, right salpingectomy. History of endometriosis and lysis of adhesions.  EXAM: TRANSVAGINAL ULTRASOUND  OF PELVIS  DOPPLER ULTRASOUND OF OVARIES  TECHNIQUE: Transvaginal ultrasound examination of the pelvis was performed including evaluation of the uterus, ovaries, adnexal regions, and pelvic cul-de-sac.  Color and duplex Doppler ultrasound was utilized to evaluate blood flow to the ovaries.  COMPARISON:  CT of the abdomen and pelvis 01/18/2015, pelvic ultrasound 01/16/2015 performed at [facility] gynecology.  FINDINGS: Uterus  Measurements: Surgically absent. Vaginal cuff has a normal appearance.  Endometrium  Thickness: Surgically absent uterus.  Right ovary  Measurements: Surgically absent.  No right adnexal mass.  Left ovary  Measurements: 5.4 x 5.2 x 5.1 cm. Within the left  ovary there is a septated cystic lesion measuring 4.7 x 3.8 x 4.1 cm. Septations are avascular. There is color flow within the ovarian parenchyma.  Pulsed Doppler evaluation demonstrates normal low-resistance arterial and venous waveforms in the left ovary.  IMPRESSION: 1. Complex cystic lesion within the left ovary. Findings favor hemorrhagic cyst. 2. Given the history of pain, follow-up is recommended. Pelvic ultrasound is suggested in 8-12 weeks. 3. Status post hysterectomy and right oophorectomy.   Electronically Signed   By: Nolon Nations M.D.   On: 06/14/2015 14:31    Review of Systems  Constitutional: Positive for chills. Negative for fever.  Gastrointestinal: Positive for nausea and abdominal pain. Negative for vomiting.   Physical Exam   Blood pressure 124/74, pulse 81, temperature 98.2 F (36.8 C), temperature source Oral, resp. rate 20.  Physical Exam  Constitutional: She is oriented to person, place, and time. She appears distressed.  HENT:  Head: Normocephalic.  Respiratory: Tachypnea noted. No respiratory distress.  GI: Soft. She exhibits distension. There is tenderness in the suprapubic area and left lower quadrant. There is rigidity and guarding. There is no rebound.  Neurological: She is alert and oriented  to person, place, and time.  Skin: Skin is warm.  Psychiatric: Her mood appears anxious. Her affect is not inappropriate.    MAU Course  Procedures  None  MDM  Morphine 2 mg given IV; confirmed allergies. Patient had this in April with her last surgery and did ok.  Toradol 30 mg IV @1300   Pelvic US complete with transvaginal US  Morphine 2 mg given @1415   Dr. Toney Rakes in MAU to evaluate the patient. OR consent signed by both the patient and Dr. Toney Rakes.   Per the RN the patient has requested another MD to evaluate her. I confirmed this with the patient and her family in which they all agree that they would like another MD to evaluate the patient. Dr.Fernadez notified.  Discussed situation with Dr. Kennon Rounds; Dr. Kennon Rounds agrees to come to the bedside in MAU to evaluate the patient.   10 mg of immediate release oxycodone  30 mg IM Toradol  1600: patient resting in bed, states her pain has significantly improved, states she feels much better and is ready to go home. Patient is present with her daughters and significant other.    Patient currently rates her pain 0/10 and voices much appreciation for the care she received today.      Assessment and Plan   A:  1. Hemorrhagic ovarian cyst   2. Abdominal pain     P:  Discharge home in stable condition RX: Oxycodone immediate release 5 mg tabs #20 NRF        Ibuprofen 800 mg         Colace  Return to MAU if symptoms worsen Follow up US in 6 weeks. Patient to call [facility] and schedule an appointment to see Dr. Kennon Rounds after pelvic US. Patient is aware and agrees with plan of care    Lezlie Lye, NP 06/14/2015 2:02 PM

## 2015-06-14 NOTE — ED Notes (Signed)
Maternity admission [facility] note:  Patient is a 39 year old who presented to the office today stating that since yesterday she began experiencing mid low abdominal discomfort. Patient denied any fever but significant worsening discomfort today and presented to the hospital. Review of her record indicated the following:   On April 19 of this year she was taken to the operating room as an emergency because of a ruptured right ovarian cyst with possible torsion the amount of pain the patient was experiencing. She underwent a laparoscopic right salpingo-oophorectomy and lysis of extensive abdominal pelvic adhesions. It was noted intraoperatively that she had 500 cc of blood in the cul-de-sac.  On April 21 patient was seen in the emergency room at [facility] complaining of severe left-sided rib and upper abdominal pain which was contributing to her shortness of breath and she felt her like swollen. Her O2 sats were 100% and pulse 65 at that time. She had a CT reassuring that there is no evidence of pulmonary embolism and underneath the abdominal wall consistent with recent surgery and no intra-abdominal abnormalities reported. She also had troponin blood test which was negative 2 patient was instructed to discontinue Demerol and Percocet at that time.  May 5 patient was seen for her postop appointment and was doing well and was asymptomatic. Her postop hemoglobin was 12.6.   I was called in to see her because of the pain that she was experiencing. She had IV line going on and was in the process of having an ultrasound when I walked into the room. She had her knees bent and was complaining of severe abdominal pain. Brief abdominal exam demonstrated good bowel sounds abdomen was soft nontender with no rebound. She was moved immediately to ultrasound and the following was reported which were shared with the patient:  FINDINGS: Uterus  Measurements: Surgically absent. Vaginal cuff has a  normal appearance.  Endometrium  Thickness: Surgically absent uterus.  Right ovary  Measurements: Surgically absent. No right adnexal mass.  Left ovary  Measurements: 5.4 x 5.2 x 5.1 cm. Within the left ovary there is a septated cystic lesion measuring 4.7 x 3.8 x 4.1 cm. Septations are avascular. There is color flow within the ovarian parenchyma.  Pulsed Doppler evaluation demonstrates normal low-resistance arterial and venous waveforms in the left ovary.  IMPRESSION: 1. Complex cystic lesion within the left ovary. Findings favor hemorrhagic cyst. 2. Given the history of pain, follow-up is recommended. Pelvic ultrasound is suggested in 8-12 weeks. 3. Status post hysterectomy and right oophorectomy  While in the ultrasound room I spoke with her and her husband with my recommendation to proceed with emergency laparoscopy, possible left ovarian cystectomy, possible left salpingo-oophorectomy, possible laparotomy. I also discussed potential risk from surgery to include the following: #1 hemorrhage which could eventually result in blood transfusion with potential risk of anaphylactic reaction, hepatitis and AIDS #2 trauma to internal organs requiring emergency laparotomy and repair of injury to bladder or bowel #3 in the event that the procedure is not able to be carried out laparoscopically an open laparotomy technique may need to be utilized. #4 all efforts will be made to preserve her remaining ovary but if her ovary is to be removed in the event of torsion that she will need to be placed on hormone replacement therapy #5 potential risk of DVT and pulmonary embolism and for this reason she will have PAS stockings #6 the risk of infection for which antibodies will be provided  The appear to be in  agreement. I went back to my office and received a phone call from the nurse that the patient was requesting another surgeon to take over her care and thus the patient will be seen  by the teaching/faculty service for which I appreciate them taking over her care.

## 2015-06-14 NOTE — MAU Note (Signed)
Pt. And family requesting new provider. Venia Carbon, NP made aware. Dr. Shawnie Pons called for evaluation.

## 2015-06-14 NOTE — MAU Note (Signed)
Dr. Ardyth Harps at beside assessing pt. Ok to take patient for ultrasound at this time.

## 2015-06-14 NOTE — MAU Note (Signed)
Pt. States pain medication is taking the edge off. Still rates pain 7.5/10. Pt. States lower abdomen feels like a constant contraction.

## 2015-07-16 ENCOUNTER — Ambulatory Visit (HOSPITAL_COMMUNITY): Payer: 59

## 2015-09-28 ENCOUNTER — Encounter (HOSPITAL_COMMUNITY): Payer: Self-pay | Admitting: Emergency Medicine

## 2015-09-28 ENCOUNTER — Encounter (HOSPITAL_COMMUNITY): Payer: Self-pay | Admitting: *Deleted

## 2015-09-28 ENCOUNTER — Emergency Department (HOSPITAL_COMMUNITY)
Admission: EM | Admit: 2015-09-28 | Discharge: 2015-09-28 | Discharge status: Against Medical Advice | Payer: 59 | Source: Home / Self Care

## 2015-09-28 ENCOUNTER — Emergency Department (HOSPITAL_COMMUNITY)
Admission: EM | Admit: 2015-09-28 | Discharge: 2015-09-29 | Disposition: A | Discharge status: Home / Self Care | Payer: 59 | Attending: Emergency Medicine | Admitting: Emergency Medicine

## 2015-09-28 DIAGNOSIS — F909 Attention-deficit hyperactivity disorder, unspecified type: Secondary | ICD-10-CM | POA: Diagnosis not present

## 2015-09-28 DIAGNOSIS — M542 Cervicalgia: Secondary | ICD-10-CM | POA: Insufficient documentation

## 2015-09-28 DIAGNOSIS — Z9889 Other specified postprocedural states: Secondary | ICD-10-CM | POA: Insufficient documentation

## 2015-09-28 DIAGNOSIS — M546 Pain in thoracic spine: Secondary | ICD-10-CM | POA: Insufficient documentation

## 2015-09-28 DIAGNOSIS — Z8742 Personal history of other diseases of the female genital tract: Secondary | ICD-10-CM | POA: Insufficient documentation

## 2015-09-28 DIAGNOSIS — M25511 Pain in right shoulder: Secondary | ICD-10-CM | POA: Diagnosis present

## 2015-09-28 DIAGNOSIS — M436 Torticollis: Secondary | ICD-10-CM | POA: Diagnosis not present

## 2015-09-28 DIAGNOSIS — Z791 Long term (current) use of non-steroidal anti-inflammatories (NSAID): Secondary | ICD-10-CM | POA: Insufficient documentation

## 2015-09-28 MED ORDER — DIAZEPAM 5 MG PO TABS
10.0000 mg | ORAL_TABLET | Freq: Once | ORAL | Status: AC
  Administered 2015-09-28: 10 mg via ORAL
  Filled 2015-09-28: qty 2

## 2015-09-28 MED ORDER — ONDANSETRON 4 MG PO TBDP
ORAL_TABLET | ORAL | Status: AC
  Administered 2015-09-28: 4 mg
  Filled 2015-09-28: qty 1

## 2015-09-28 MED ORDER — ONDANSETRON HCL 4 MG PO TABS
4.0000 mg | ORAL_TABLET | Freq: Once | ORAL | Status: AC
  Administered 2015-09-28: 4 mg via ORAL
  Filled 2015-09-28: qty 1

## 2015-09-28 MED ORDER — OXYCODONE-ACETAMINOPHEN 5-325 MG PO TABS
ORAL_TABLET | ORAL | Status: AC
  Administered 2015-09-28: 1
  Filled 2015-09-28: qty 1

## 2015-09-28 MED ORDER — HYDROCODONE-ACETAMINOPHEN 5-325 MG PO TABS
2.0000 | ORAL_TABLET | Freq: Once | ORAL | Status: AC
  Administered 2015-09-28: 2 via ORAL
  Filled 2015-09-28: qty 2

## 2015-09-28 MED ORDER — ONDANSETRON 4 MG PO TBDP: 4.0000 mg | ORAL_TABLET | Freq: Once | ORAL | Status: DC

## 2015-09-28 MED ORDER — OXYCODONE-ACETAMINOPHEN 5-325 MG PO TABS: 1.0000 | ORAL_TABLET | Freq: Once | ORAL | Status: DC

## 2015-09-28 NOTE — ED Notes (Signed)
Pt states she is going to go to [facility] she does not want to wait here anymore. RN advised pt on place in line but pt refusing to stay. nad noted.

## 2015-09-28 NOTE — ED Notes (Addendum)
Pt from home for eval of neck pain and upper back pain x3 days, pt states unable to sleep. States same pain as when she had to have ACDF surgery. Denies any urinary symptoms. Denies any n/v/d. Pt tearful in triage, unable to move freely due to pain.

## 2015-09-28 NOTE — ED Provider Notes (Signed)
CSN: [id number]     Arrival date & time 09/28/15  2207 History   First MD Initiated Contact with Patient 09/28/15 2243     No chief complaint on file.    (Consider location/radiation/quality/duration/timing/severity/associated sxs/prior Treatment) Patient is a 39 y.o. female presenting with shoulder pain. The history is provided by the patient.  Shoulder Pain Location:  Shoulder and arm (neck) Time since incident:  3 days Injury: no   Shoulder location:  R shoulder Pain details:    Quality:  Cramping (spasm)   Severity:  Severe   Onset quality:  Gradual   Duration:  3 days   Timing:  Intermittent   Progression:  Worsening Chronicity:  Recurrent Handedness:  Right-handed Relieved by:  Nothing Worsened by:  Movement Ineffective treatments:  None tried Associated symptoms: decreased range of motion and neck pain   Associated symptoms: no numbness     Past Medical History  Diagnosis Date  . ADD (attention deficit disorder)   . Ovarian cyst     h/o   Past Surgical History  Procedure Laterality Date  . Abdominal hysterectomy    . Cesarean section    . Pelvic laparoscopy    . Anterior cervical decomp/discectomy fusion N/A 04/28/2014    Procedure: ANTERIOR CERVICAL DECOMPRESSION/DISCECTOMY FUSION 1 LEVEL Cervical six/seven;  Surgeon: Carmela HurtKyle L Cabbell, MD;  Location: [facility];  Service: Neurosurgery;  Laterality: N/A;  . Laparoscopic ovarian cystectomy Right 01/16/2015    Procedure: LAPAROSCOPIC OVARIAN CYSTECTOMY / EXTENSIVE ABDOMINAL LYSIS OF ADHESIONS;  Surgeon: Ok EdwardsJuan H Fernandez, MD;  Location: [facility];  Service: Gynecology;  Laterality: Right;  . Laparoscopic unilateral salpingectomy Right 01/16/2015    Procedure: LAPAROSCOPIC UNILATERAL SALPINGECTOMY;  Surgeon: Ok EdwardsJuan H Fernandez, MD;  Location: [facility];  Service: Gynecology;  Laterality: Right;   Family History  Problem Relation Age of Onset  . Heart disease Mother   . Diabetes Mother   . Hypertension Mother   . Diabetes  Maternal Grandmother   . Heart disease Maternal Grandfather   . Heart attack Maternal Grandfather    Social History  Substance Use Topics  . Smoking status: Never Smoker   . Smokeless tobacco: Never Used  . Alcohol Use: No   OB History    Gravida Para Term Preterm AB TAB SAB Ectopic Multiple Living   1 1 1       1      Review of Systems  Musculoskeletal: Positive for neck pain.  All other systems reviewed and are negative.     Allergies  Dilaudid and Cephalexin  Home Medications   Prior to Admission medications   Medication Sig Start Date End Date Taking? Authorizing Provider  amphetamine-dextroamphetamine (ADDERALL XR) 20 MG 24 hr capsule Take 20 mg by mouth daily. At 6 am   Yes Historical Provider, MD  amphetamine-dextroamphetamine (ADDERALL) 20 MG tablet Take 20 mg by mouth daily. Takes in the afternoon   Yes Historical Provider, MD  ibuprofen (ADVIL,MOTRIN) 800 MG tablet Take 1 tablet (800 mg total) by mouth 3 (three) times daily. 06/14/15  Yes Harolyn RutherfordJennifer I Rasch, NP   BP 126/64 mmHg  Pulse 66  Temp(Src) 97.9 F (36.6 C) (Oral)  Resp 18  Wt 59.421 kg  SpO2 100% Physical Exam  Constitutional: She is oriented to person, place, and time. She appears well-developed and well-nourished.  Non-toxic appearance.  Pt tearful and c/o pain/spasm.  HENT:  Head: Normocephalic.  Right Ear: Tympanic membrane and external ear normal.  Left Ear: Tympanic membrane  and external ear normal.  Eyes: EOM and lids are normal. Pupils are equal, round, and reactive to light.  Neck: Normal range of motion. Neck supple. Carotid bruit is not present.  Cardiovascular: Normal rate, regular rhythm, normal heart sounds, intact distal pulses and normal pulses.   Pulmonary/Chest: Breath sounds normal. No respiratory distress.  Abdominal: Soft. Bowel sounds are normal. There is no tenderness. There is no guarding.  Musculoskeletal: Normal range of motion.  Muscle tightness and tenseness of the right  upper and lower trapez  Lymphadenopathy:       Head (right side): No submandibular adenopathy present.       Head (left side): No submandibular adenopathy present.    She has no cervical adenopathy.  Neurological: She is alert and oriented to person, place, and time. She has normal strength. No cranial nerve deficit or sensory deficit.  Grip symmetrical. Pt can flex and extend wrist against resistance, but this aggravates spasm. No atrophy of the forearm or hand muscles.  Skin: Skin is warm and dry.  Psychiatric: She has a normal mood and affect. Her speech is normal.  Nursing note and vitals reviewed.   ED Course  Procedures (including critical care time) Labs Review Labs Reviewed - No data to display  Imaging Review No results found. I have personally reviewed and evaluated these images and lab results as part of my medical decision-making.   EKG Interpretation None      MDM  Vital signs reviewed. No acute problem noted.  Patient is head anterior cervical discectomy and fusion in the past. She states that her pain is in someway similar. She reports neck pain and spasm and back pain in the past, but these will usually improved with ibuprofen, heat, and rest. She states that she has been unable to rest over the last 3 days. The examination at this time is negative for acute neuro deficits. The patient is ambulatory without problem. The grip is symmetrical. There no acute deficits on the sensory examination. Exam is limited due to patient's pain.  The patient is treated with Decadron, Norco, and Flexeril. The patient is advised to discuss these changes with her primary physician, and to seek an artery and some with her neurosurgery team for reevaluation. The patient is to return to the emergency department if the problem progresses.    Final diagnoses:  None    **I have reviewed nursing notes, vital signs, and all appropriate lab and imaging results for this  patient.Ivery Quale, PA-C 09/29/15 1905  Azalia Bilis, MD 09/29/15 [phone]

## 2015-09-28 NOTE — ED Notes (Signed)
Pt reporting muscle spasms on right side of neck, arm and back.  States spasms have been going on for past 3 days.

## 2015-09-29 MED ORDER — DEXAMETHASONE 4 MG PO TABS: 4.0000 mg | ORAL_TABLET | Freq: Two times a day (BID) | ORAL | Status: DC

## 2015-09-29 MED ORDER — CYCLOBENZAPRINE HCL 10 MG PO TABS: 10.0000 mg | ORAL_TABLET | Freq: Three times a day (TID) | ORAL | Status: DC

## 2015-09-29 MED ORDER — HYDROCODONE-ACETAMINOPHEN 5-325 MG PO TABS: 1.0000 | ORAL_TABLET | ORAL | Status: DC | PRN

## 2015-09-29 NOTE — ED Notes (Signed)
Patient given discharge instruction, verbalized understand. Patient ambulatory out of the department.  

## 2015-09-29 NOTE — Discharge Instructions (Signed)
Please discuss the break-through spasm attacks with Courtney Parsons. May need follow up and recheck with the neuro surgeon. Acute Torticollis Torticollis is a condition in which the muscles of the neck tighten (contract) abnormally, causing the neck to twist and the head to move into an unnatural position. Torticollis that develops suddenly is called acute torticollis. If torticollis becomes chronic and is left untreated, the face and neck can become deformed. CAUSES This condition may be caused by:  Sleeping in an awkward position (common).  Extending or twisting the neck muscles beyond their normal position.  Infection. In some cases, the cause may not be known. SYMPTOMS Symptoms of this condition include:  An unnatural position of the head.  Neck pain.  A limited ability to move the neck.  Twisting of the neck to one side. DIAGNOSIS This condition is diagnosed with a physical exam. You may also have imaging tests, such as an X-ray, CT scan, or MRI. TREATMENT Treatment for this condition involves trying to relax the neck muscles. It may include:  Medicines or shots.  Physical therapy.  Surgery. This may be done in severe cases. HOME CARE INSTRUCTIONS  Take medicines only as directed by your health care provider.  Do stretching exercises and massage your neck as directed by your health care provider.  Keep all follow-up visits as directed by your health care provider. This is important. SEEK MEDICAL CARE IF:  You develop a fever. SEEK IMMEDIATE MEDICAL CARE IF:  You develop difficulty breathing.  You develop noisy breathing (stridor).  You start drooling.  You have trouble swallowing or have pain with swallowing.  You develop numbness or weakness in your hands or feet.  You have changes in your speech, understanding, or vision.  Your pain gets worse.   This information is not intended to replace advice given to you by your health care provider. Make sure you  discuss any questions you have with your health care provider.   Document Released: 09/12/2000 Document Revised: 01/30/2015 Document Reviewed: 09/11/2014 Elsevier Interactive Patient Education [employer]2016 [employer]

## 2015-10-24 MED FILL — AMPHETAMINE SALTS 20 MG TAB: 20 | 30 days supply | Qty: 30 | Fill #0

## 2015-10-24 MED FILL — DEXTROAMP-AMPHET ER 20 MG C: 20 | 30 days supply | Qty: 30 | Fill #0

## 2015-11-29 MED FILL — DEXTROAMP-AMPHET ER 20 MG C: 20 | 30 days supply | Qty: 30 | Fill #0

## 2015-11-29 MED FILL — AMPHETAMINE SALTS 20 MG TAB: 20 | 30 days supply | Qty: 30 | Fill #0

## 2015-12-12 ENCOUNTER — Encounter (HOSPITAL_COMMUNITY): Payer: Self-pay | Admitting: Emergency Medicine

## 2015-12-12 ENCOUNTER — Emergency Department (HOSPITAL_COMMUNITY)
Admission: EM | Admit: 2015-12-12 | Discharge: 2015-12-12 | Disposition: A | Discharge status: Home / Self Care | Payer: 59 | Attending: Emergency Medicine | Admitting: Emergency Medicine

## 2015-12-12 ENCOUNTER — Emergency Department (HOSPITAL_COMMUNITY): Payer: 59

## 2015-12-12 DIAGNOSIS — F909 Attention-deficit hyperactivity disorder, unspecified type: Secondary | ICD-10-CM | POA: Diagnosis not present

## 2015-12-12 DIAGNOSIS — N390 Urinary tract infection, site not specified: Secondary | ICD-10-CM | POA: Diagnosis not present

## 2015-12-12 DIAGNOSIS — Z79899 Other long term (current) drug therapy: Secondary | ICD-10-CM | POA: Insufficient documentation

## 2015-12-12 DIAGNOSIS — R109 Unspecified abdominal pain: Secondary | ICD-10-CM

## 2015-12-12 DIAGNOSIS — Z791 Long term (current) use of non-steroidal anti-inflammatories (NSAID): Secondary | ICD-10-CM | POA: Insufficient documentation

## 2015-12-12 LAB — URINE MICROSCOPIC-ADD ON

## 2015-12-12 LAB — URINALYSIS, ROUTINE W REFLEX MICROSCOPIC
BILIRUBIN URINE: NEGATIVE
Glucose, UA: NEGATIVE mg/dL
KETONES UR: NEGATIVE mg/dL
NITRITE: NEGATIVE
PH: 6 (ref 5.0–8.0)
Protein, ur: NEGATIVE mg/dL
Specific Gravity, Urine: <1.005 — ABNORMAL LOW (ref 1.005–1.030)

## 2015-12-12 LAB — PREGNANCY, URINE: Preg Test, Ur: NEGATIVE

## 2015-12-12 MED ORDER — NAPROXEN 500 MG PO TABS: ORAL_TABLET | ORAL | Status: DC

## 2015-12-12 MED ORDER — CIPROFLOXACIN IN D5W 400 MG/200ML IV SOLN
400.0000 mg | Freq: Once | INTRAVENOUS | Status: AC
  Administered 2015-12-12: 400 mg via INTRAVENOUS

## 2015-12-12 MED ORDER — TRAMADOL HCL 50 MG PO TABS
ORAL_TABLET | ORAL | Status: AC
  Filled 2015-12-12: qty 2

## 2015-12-12 MED ORDER — PHENAZOPYRIDINE HCL 200 MG PO TABS: 200.0000 mg | ORAL_TABLET | Freq: Three times a day (TID) | ORAL | Status: DC | PRN

## 2015-12-12 MED ORDER — ONDANSETRON HCL 4 MG/2ML IJ SOLN
4.0000 mg | Freq: Once | INTRAMUSCULAR | Status: AC
  Administered 2015-12-12: 4 mg via INTRAVENOUS

## 2015-12-12 MED ORDER — PHENAZOPYRIDINE HCL 100 MG PO TABS
200.0000 mg | ORAL_TABLET | Freq: Once | ORAL | Status: AC
  Administered 2015-12-12: 200 mg via ORAL

## 2015-12-12 MED ORDER — CIPROFLOXACIN IN D5W 400 MG/200ML IV SOLN
INTRAVENOUS | Status: AC
  Filled 2015-12-12: qty 200

## 2015-12-12 MED ORDER — ONDANSETRON HCL 4 MG/2ML IJ SOLN
INTRAMUSCULAR | Status: AC
  Filled 2015-12-12: qty 2

## 2015-12-12 MED ORDER — KETOROLAC TROMETHAMINE 30 MG/ML IJ SOLN
30.0000 mg | Freq: Once | INTRAMUSCULAR | Status: AC
  Administered 2015-12-12: 30 mg via INTRAVENOUS

## 2015-12-12 MED ORDER — MORPHINE SULFATE (PF) 4 MG/ML IV SOLN
INTRAVENOUS | Status: AC
  Filled 2015-12-12: qty 1

## 2015-12-12 MED ORDER — CYCLOBENZAPRINE HCL 5 MG PO TABS: 5.0000 mg | ORAL_TABLET | Freq: Three times a day (TID) | ORAL | Status: DC | PRN

## 2015-12-12 MED ORDER — PHENAZOPYRIDINE HCL 100 MG PO TABS
ORAL_TABLET | ORAL | Status: AC
  Filled 2015-12-12: qty 2

## 2015-12-12 MED ORDER — SODIUM CHLORIDE 0.9 % IV SOLN
Freq: Once | INTRAVENOUS | Status: AC
  Administered 2015-12-12: 04:00:00 via INTRAVENOUS

## 2015-12-12 MED ORDER — CIPROFLOXACIN HCL 500 MG PO TABS: 500.0000 mg | ORAL_TABLET | Freq: Two times a day (BID) | ORAL | Status: DC

## 2015-12-12 MED ORDER — KETOROLAC TROMETHAMINE 30 MG/ML IJ SOLN
INTRAMUSCULAR | Status: AC
  Filled 2015-12-12: qty 1

## 2015-12-12 MED ORDER — TRAMADOL HCL 50 MG PO TABS
100.0000 mg | ORAL_TABLET | Freq: Once | ORAL | Status: AC
  Administered 2015-12-12: 100 mg via ORAL

## 2015-12-12 MED ORDER — MORPHINE SULFATE (PF) 4 MG/ML IV SOLN
4.0000 mg | Freq: Once | INTRAVENOUS | Status: AC
  Administered 2015-12-12: 4 mg via INTRAVENOUS

## 2015-12-12 NOTE — ED Provider Notes (Signed)
CSN: [id number]     Arrival date & time 12/12/15  0320 History   First MD Initiated Contact with Patient 12/12/15 0335    Chief Complaint  Patient presents with  . Flank Pain     (Consider location/radiation/quality/duration/timing/severity/associated sxs/prior Treatment) HPI patient reports a few days ago she started getting lower abdominal pain and indicates her suprapubic area that she describes as a pressure. She states it burns when she urinates and she has been dribbling. She also has had some hematuria. She states tonight about an hour and a half ago she started having worsening pain and right flank pain. She's had nausea without vomiting. She states sitting upright makes the pain worse, nothing makes it feel better. She denies any fever. She states she had similar symptoms when she had pyelonephritis in the past.   PCP Dr Sherlyn Lick  Past Medical History  Diagnosis Date  . ADD (attention deficit disorder)   . Ovarian cyst     h/o   Past Surgical History  Procedure Laterality Date  . Abdominal hysterectomy    . Cesarean section    . Pelvic laparoscopy    . Anterior cervical decomp/discectomy fusion N/A 04/28/2014    Procedure: ANTERIOR CERVICAL DECOMPRESSION/DISCECTOMY FUSION 1 LEVEL Cervical six/seven;  Surgeon: Carmela Hurt, MD;  Location: [facility];  Service: Neurosurgery;  Laterality: N/A;  . Laparoscopic ovarian cystectomy Right 01/16/2015    Procedure: LAPAROSCOPIC OVARIAN CYSTECTOMY / EXTENSIVE ABDOMINAL LYSIS OF ADHESIONS;  Surgeon: Ok Edwards, MD;  Location: [facility];  Service: Gynecology;  Laterality: Right;  . Laparoscopic unilateral salpingectomy Right 01/16/2015    Procedure: LAPAROSCOPIC UNILATERAL SALPINGECTOMY;  Surgeon: Ok Edwards, MD;  Location: [facility];  Service: Gynecology;  Laterality: Right;   Family History  Problem Relation Age of Onset  . Heart disease Mother   . Diabetes Mother   . Hypertension Mother   . Diabetes Maternal Grandmother   .  Heart disease Maternal Grandfather   . Heart attack Maternal Grandfather    Social History  Substance Use Topics  . Smoking status: Never Smoker   . Smokeless tobacco: Never Used  . Alcohol Use: No   employed  OB History    Gravida Para Term Preterm AB TAB SAB Ectopic Multiple Living   Review of Systems  All other systems reviewed and are negative.     Allergies  Dilaudid and Cephalexin  Home Medications   Prior to Admission medications   Medication Sig Start Date End Date Taking? Authorizing Provider  amphetamine-dextroamphetamine (ADDERALL XR) 20 MG 24 hr capsule Take 20 mg by mouth daily. At 6 am    Historical Provider, MD  amphetamine-dextroamphetamine (ADDERALL) 20 MG tablet Take 20 mg by mouth daily. Takes in the afternoon    Historical Provider, MD  ciprofloxacin (CIPRO) 500 MG tablet Take 1 tablet (500 mg total) by mouth 2 (two) times daily. 12/12/15   Devoria Albe, MD  cyclobenzaprine (FLEXERIL) 5 MG tablet Take 1 tablet (5 mg total) by mouth 3 (three) times daily as needed (muscle pain). 12/12/15   Devoria Albe, MD  dexamethasone (DECADRON) 4 MG tablet Take 1 tablet (4 mg total) by mouth 2 (two) times daily with a meal. 09/29/15   Ivery Quale, PA-C  HYDROcodone-acetaminophen (NORCO/VICODIN) 5-325 MG tablet Take 1 tablet by mouth every 4 (four) hours as needed. 09/29/15   Ivery Quale, PA-C  ibuprofen (ADVIL,MOTRIN) 800 MG  tablet Take 1 tablet (800 mg total) by mouth 3 (three) times daily. 06/14/15   Duane Lope, NP  naproxen (NAPROSYN) 500 MG tablet Take 1 po BID with food prn pain 12/12/15   Devoria Albe, MD  phenazopyridine (PYRIDIUM) 200 MG tablet Take 1 tablet (200 mg total) by mouth 3 (three) times daily as needed for pain (on urination). 12/12/15   Devoria Albe, MD   BP 137/102 mmHg  Pulse 82  Temp(Src) 97.5 F (36.4 C) (Oral)  Resp 20  Ht 5' (1.524 m)  Wt 132 lb (59.875 kg)  BMI 25.78 kg/m2  Vital signs normal   Physical Exam   Constitutional: She is oriented to person, place, and time. She appears well-developed and well-nourished.  Non-toxic appearance. She does not appear ill. She appears distressed.  HENT:  Head: Normocephalic and atraumatic.  Right Ear: External ear normal.  Left Ear: External ear normal.  Nose: Nose normal. No mucosal edema or rhinorrhea.  Mouth/Throat: Oropharynx is clear and moist and mucous membranes are normal. No dental abscesses or uvula swelling.  Eyes: Conjunctivae and EOM are normal. Pupils are equal, round, and reactive to light.  Neck: Normal range of motion and full passive range of motion without pain. Neck supple.  Cardiovascular: Normal rate, regular rhythm and normal heart sounds.  Exam reveals no gallop and no friction rub.   No murmur heard. Pulmonary/Chest: Effort normal and breath sounds normal. No respiratory distress. She has no wheezes. She has no rhonchi. She has no rales. She exhibits no tenderness and no crepitus.  Refuses to lay flat or sit up straight, wants to sit leaning forward    Abdominal: Soft. Normal appearance and bowel sounds are normal. She exhibits no distension. There is no tenderness. There is no rebound and no guarding.  Refuses to lay flat or sit up straight, wants to sit leaning forward   Genitourinary:  + right flank pain to percussion  Musculoskeletal: Normal range of motion. She exhibits no edema or tenderness.  Moves all extremities well.   Neurological: She is alert and oriented to person, place, and time. She has normal strength. No cranial nerve deficit.  Skin: Skin is warm, dry and intact. No rash noted. No erythema. No pallor.  Psychiatric: She has a normal mood and affect. Her speech is normal and behavior is normal. Her mood appears not anxious.  Nursing note and vitals reviewed.   ED Course  Procedures (including critical care time)  Medications  ciprofloxacin (CIPRO) IVPB 400 mg (400 mg Intravenous New Bag/Given 12/12/15 0456)   ketorolac (TORADOL) 30 MG/ML injection 30 mg (30 mg Intravenous Given 12/12/15 0424)  morphine 4 MG/ML injection 4 mg (4 mg Intravenous Given 12/12/15 0424)  ondansetron (ZOFRAN) injection 4 mg (4 mg Intravenous Given 12/12/15 0424)  0.9 %  sodium chloride infusion ( Intravenous New Bag/Given 12/12/15 0424)  phenazopyridine (PYRIDIUM) tablet 200 mg (200 mg Oral Given 12/12/15 0534)    Patient was given IV fluids, IV Toradol and IV morphine. Patient has allergic reaction to Dilaudid.   Recheck at 4:45 AM patient appears much more comfortable she is now able to sit back on the stretcher. She was given her urinalysis results. She states she can take Cipro and it usually works well for her.  Recheck at 5:45 AM patient was given her CT results. Her antibiotics almost in. She appears to be much more comfortable than when she arrived. We discussed taking her antibiotics and to be rechecked if she  gets a fever or vomiting.   Please note although the triage nurse notes patient was having left flank pain on my exam her pain was on the right and she also held her hand on her right flank.  Labs Review Results for orders placed or performed during the hospital encounter of 12/12/15  Urinalysis, Routine w reflex microscopic  Result Value Ref Range   Color, Urine YELLOW YELLOW   APPearance CLEAR CLEAR   Specific Gravity, Urine <1.005 (L) 1.005 - 1.030   pH 6.0 5.0 - 8.0   Glucose, UA NEGATIVE NEGATIVE mg/dL   Hgb urine dipstick LARGE (A) NEGATIVE   Bilirubin Urine NEGATIVE NEGATIVE   Ketones, ur NEGATIVE NEGATIVE mg/dL   Protein, ur NEGATIVE NEGATIVE mg/dL   Nitrite NEGATIVE NEGATIVE   Leukocytes, UA LARGE (A) NEGATIVE  Pregnancy, urine  Result Value Ref Range   Preg Test, Ur NEGATIVE NEGATIVE  Urine microscopic-add on  Result Value Ref Range   Squamous Epithelial / LPF 6-30 (A) NONE SEEN   WBC, UA TOO NUMEROUS TO COUNT 0 - 5 WBC/hpf   RBC / HPF 0-5 0 - 5 RBC/hpf   Bacteria, UA MANY (A) NONE  SEEN   Laboratory interpretation all normal except UTI     Imaging Review Ct Renal Stone Study  12/12/2015  CLINICAL DATA:  Left flank pain radiating to the lower abdomen for 2 days, worsening. EXAM: CT ABDOMEN AND PELVIS WITHOUT CONTRAST TECHNIQUE: Multidetector CT imaging of the abdomen and pelvis was performed following the standard protocol without IV contrast. COMPARISON:  01/18/2015 FINDINGS: Lower chest and abdominal wall:  Negative. Hepatobiliary: No evidence of hepatic lesion.No evidence of biliary obstruction or stone. Pancreas: Unremarkable. Spleen: Unremarkable. Adrenals/Urinary Tract: Negative adrenals. No hydronephrosis or stone. Unremarkable bladder. Reproductive:Hysterectomy with right oophorectomy. Persistent 3 cm low-density cystic enlargement of the left ovary. Stomach/Bowel:  No obstruction or inflammatory change. Vascular/Lymphatic: No acute vascular abnormality. No mass or adenopathy. Peritoneal: No ascites or pneumoperitoneum. Musculoskeletal: No acute abnormalities.  Lumbar facet arthropathy. IMPRESSION: 1. No acute finding.  No hydronephrosis or nephrolithiasis. 2. Complex left ovarian cyst noted by sonography 06/14/2015 persists or has recurred. Recommend nonemergent follow-up pelvic ultrasound. Electronically Signed   By: Marnee SpringJonathon  Watts M.D.   On: 12/12/2015 05:10   I have personally reviewed and evaluated these images and lab results as part of my medical decision-making.    MDM   Final diagnoses:  UTI (lower urinary tract infection)     New Prescriptions   CIPROFLOXACIN (CIPRO) 500 MG TABLET    Take 1 tablet (500 mg total) by mouth 2 (two) times daily.   CYCLOBENZAPRINE (FLEXERIL) 5 MG TABLET    Take 1 tablet (5 mg total) by mouth 3 (three) times daily as needed (muscle pain).   NAPROXEN (NAPROSYN) 500 MG TABLET    Take 1 po BID with food prn pain   PHENAZOPYRIDINE (PYRIDIUM) 200 MG TABLET    Take 1 tablet (200 mg total) by mouth 3 (three) times daily as needed  for pain (on urination).    Plan discharge  Devoria AlbeIva Ciella Obi, MD, [facility] PyoFACEP    Keiasha Diep, MD 12/12/15 [phone]

## 2015-12-12 NOTE — ED Notes (Signed)
Pt c/o left flank pain that radiates to lower abd with dysuria and hematuria.

## 2015-12-12 NOTE — Discharge Instructions (Signed)
Drink plenty of fluids. Take the medication as prescribed. Recheck if you get a fever or vomiting or you are not improving over the next couple of days. If you are not improving your doctor can check your urine culture results to make sure I chose the correct antibiotic for your infection. Urinary Tract Infection Urinary tract infections (UTIs) can develop anywhere along your urinary tract. Your urinary tract is your body's drainage system for removing wastes and extra water. Your urinary tract includes two kidneys, two ureters, a bladder, and a urethra. Your kidneys are a pair of bean-shaped organs. Each kidney is about the size of your fist. They are located below your ribs, one on each side of your spine. CAUSES Infections are caused by microbes, which are microscopic organisms, including fungi, viruses, and bacteria. These organisms are so small that they can only be seen through a microscope. Bacteria are the microbes that most commonly cause UTIs. SYMPTOMS  Symptoms of UTIs may vary by age and gender of the patient and by the location of the infection. Symptoms in young women typically include a frequent and intense urge to urinate and a painful, burning feeling in the bladder or urethra during urination. Older women and men are more likely to be tired, shaky, and weak and have muscle aches and abdominal pain. A fever may mean the infection is in your kidneys. Other symptoms of a kidney infection include pain in your back or sides below the ribs, nausea, and vomiting. DIAGNOSIS To diagnose a UTI, your caregiver will ask you about your symptoms. Your caregiver will also ask you to provide a urine sample. The urine sample will be tested for bacteria and white blood cells. White blood cells are made by your body to help fight infection. TREATMENT  Typically, UTIs can be treated with medication. Because most UTIs are caused by a bacterial infection, they usually can be treated with the use of  antibiotics. The choice of antibiotic and length of treatment depend on your symptoms and the type of bacteria causing your infection. HOME CARE INSTRUCTIONS  If you were prescribed antibiotics, take them exactly as your caregiver instructs you. Finish the medication even if you feel better after you have only taken some of the medication.  Drink enough water and fluids to keep your urine clear or pale yellow.  Avoid caffeine, tea, and carbonated beverages. They tend to irritate your bladder.  Empty your bladder often. Avoid holding urine for long periods of time.  Empty your bladder before and after sexual intercourse.  After a bowel movement, women should cleanse from front to back. Use each tissue only once. SEEK MEDICAL CARE IF:   You have back pain.  You develop a fever.  Your symptoms do not begin to resolve within 3 days. SEEK IMMEDIATE MEDICAL CARE IF:   You have severe back pain or lower abdominal pain.  You develop chills.  You have nausea or vomiting.  You have continued burning or discomfort with urination. MAKE SURE YOU:   Understand these instructions.  Will watch your condition.  Will get help right away if you are not doing well or get worse.   This information is not intended to replace advice given to you by your health care provider. Make sure you discuss any questions you have with your health care provider.   Document Released: 06/25/2005 Document Revised: 06/06/2015 Document Reviewed: 10/24/2011 Elsevier Interactive Patient Education [employer]2016 [employer]

## 2015-12-14 DIAGNOSIS — N390 Urinary tract infection, site not specified: Secondary | ICD-10-CM | POA: Diagnosis not present

## 2015-12-14 LAB — URINE CULTURE: Culture: >=100000

## 2015-12-14 MED FILL — SULFAMETHOXAZOLE/TMP DS TAB: 800-160 | 7 days supply | Qty: 14 | Fill #0

## 2015-12-14 MED FILL — ONDANSETRON HCL 8 MG TABLET: 8 | 20 days supply | Qty: 20 | Fill #0

## 2015-12-15 ENCOUNTER — Telehealth (HOSPITAL_BASED_OUTPATIENT_CLINIC_OR_DEPARTMENT_OTHER): Payer: Self-pay | Admitting: Emergency Medicine

## 2015-12-15 NOTE — Telephone Encounter (Signed)
Post ED Visit - Positive Culture Follow-up  Culture report reviewed by antimicrobial stewardship pharmacist:  []  Courtney Parsons, Pharm.D. []  Celedonio MiyamotoJeremy Parsons, Pharm.D., BCPS [x]  Courtney Parsons, Pharm.D. []  Courtney Parsons, Pharm.D., BCPS []  Courtney Parsons, [address]Ph[address]D., BCPS, AAHIVP []  Courtney Parsons, Pharm.D., BCPS, AAHIVP []  Courtney Parsons, Pharm.D. []  Courtney Parsons, [address]Pharm[address]  Positive urine culture E. coli Treated with ciprofloxacin, organism sensitive to the same and no further patient follow-up is required at this time.  Courtney Parsons, Courtney Parsons 12/15/2015, 9:59 AM

## 2015-12-27 DIAGNOSIS — H5203 Hypermetropia, bilateral: Secondary | ICD-10-CM | POA: Diagnosis not present

## 2015-12-27 MED FILL — DEXTROAMP-AMPHET ER 20 MG C: 20 | 30 days supply | Qty: 30 | Fill #0

## 2015-12-27 MED FILL — AMPHETAMINE SALTS 20 MG TAB: 20 | 30 days supply | Qty: 30 | Fill #0

## 2016-01-08 DIAGNOSIS — R350 Frequency of micturition: Secondary | ICD-10-CM | POA: Diagnosis not present

## 2016-01-08 DIAGNOSIS — N3000 Acute cystitis without hematuria: Secondary | ICD-10-CM | POA: Diagnosis not present

## 2016-01-08 DIAGNOSIS — R102 Pelvic and perineal pain: Secondary | ICD-10-CM | POA: Diagnosis not present

## 2016-01-08 MED FILL — PROMETHAZINE 25 MG TABLET: 25 | 3 days supply | Qty: 15 | Fill #0

## 2016-01-08 MED FILL — NITROFURANTOIN MCR 100 MG C: 100 | 7 days supply | Qty: 14 | Fill #0

## 2016-01-16 MED FILL — BUPROPION SR 150 MG TABLET: 150 | 30 days supply | Qty: 30 | Fill #2

## 2016-01-24 MED FILL — DEXTROAMP-AMPHETAMIN 20 MG: 20 | 30 days supply | Qty: 30 | Fill #0

## 2016-01-24 MED FILL — DEXTROAMP-AMPHET ER 20 MG C: 20 | 30 days supply | Qty: 30 | Fill #0

## 2016-02-22 MED FILL — DEXTROAMP-AMPHET ER 20 MG C: 20 | 30 days supply | Qty: 30 | Fill #0

## 2016-02-22 MED FILL — DEXTROAMP-AMPHETAMIN 20 MG: 20 | 30 days supply | Qty: 30 | Fill #0

## 2016-03-21 DIAGNOSIS — F900 Attention-deficit hyperactivity disorder, predominantly inattentive type: Secondary | ICD-10-CM | POA: Diagnosis not present

## 2016-03-21 DIAGNOSIS — F419 Anxiety disorder, unspecified: Secondary | ICD-10-CM | POA: Diagnosis not present

## 2016-03-21 IMAGING — CT CT ANGIO CHEST
3 of 10 series · 9 of 46 positions shown · IV contrast (omnipaque)
Comparison: CT abdomen dated 01/29/2011

CLINICAL DATA: Severe left upper quadrant pain radiating to the
shoulder and chest. Right oophorectomy 2 days ago.

EXAM:
CT ANGIOGRAM OF THE CHEST  AND CT ABDOMEN WITH CONTRAST
TECHNIQUE: Multidetector CT imaging of the chest and abdomen was performed
following the standard protocol during bolus administration of
intravenous contrast.
CONTRAST:  50mL OMNIPAQUE IOHEXOL 300 MG/ML SOLN, 100mL OMNIPAQUE
IOHEXOL 350 MG/ML SOLN

[Series 5: pe 3.0 b40f · axial · 0.59mm/px · z∈[+890,+1043]mm · 4 of 86 slices shown]
[im 18/86  lung]
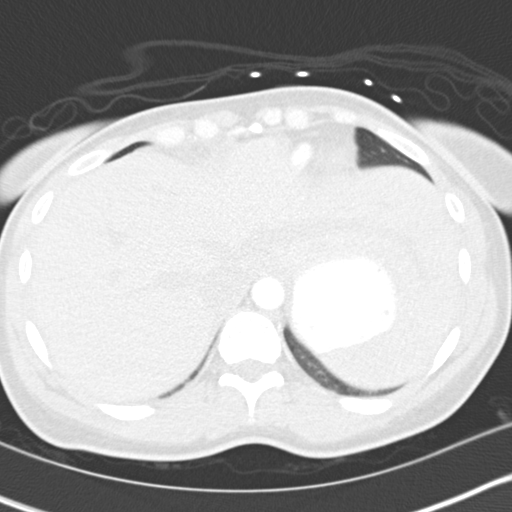
[im 35/86  soft-tissue]
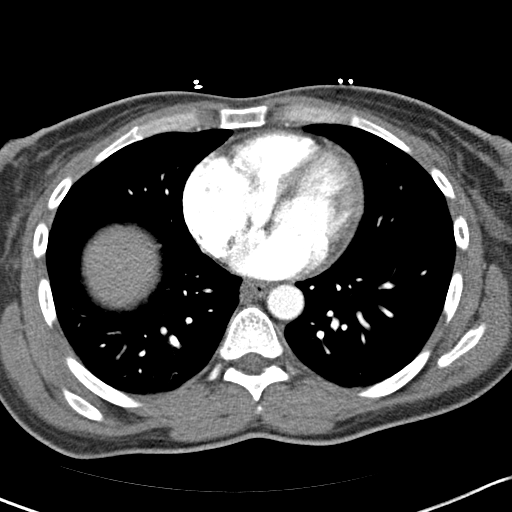
[im 52/86  lung]
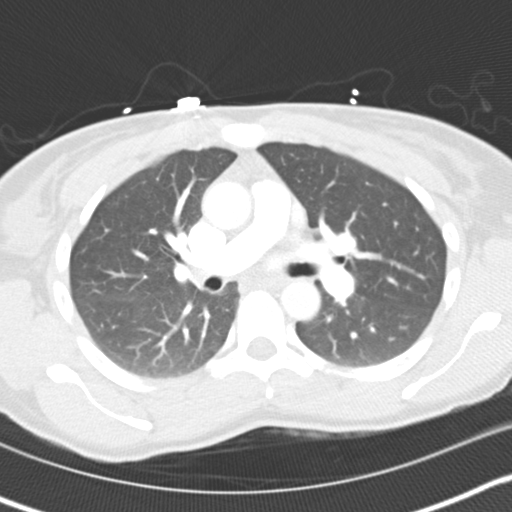
[im 69/86  soft-tissue]
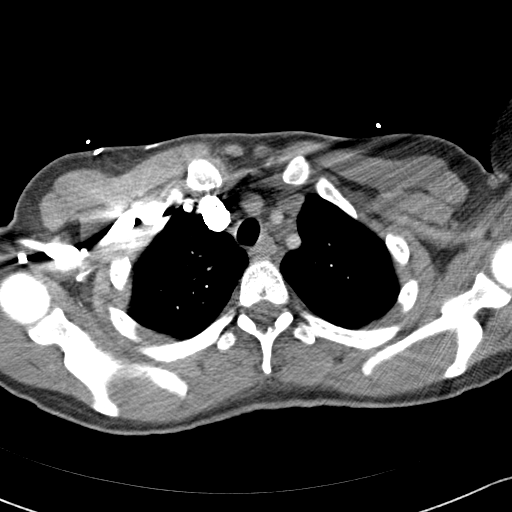

[Series 7: mpr coronal pe · coronal · 0.50mm/px · 1 of 66 slices shown]
[im 33/66  soft-tissue]
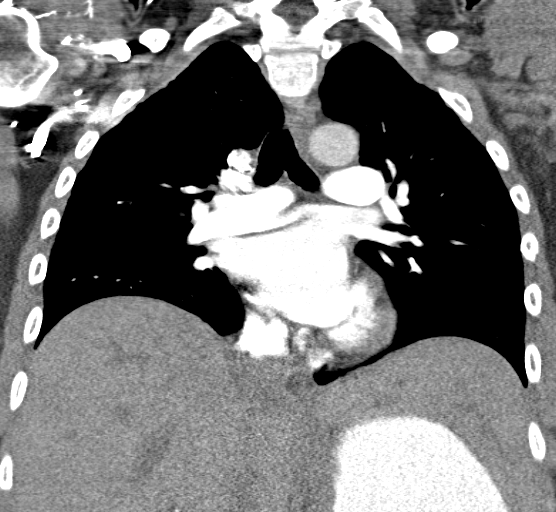

[Series 11: abd_pel 5.0 b40f · axial · 0.65mm/px · z∈[+593,+863]mm · 4 of 91 slices shown]
[im 19/91  lung]
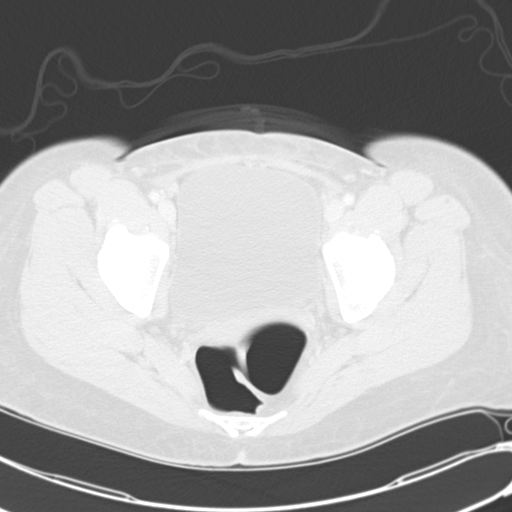
[im 37/91  lung]
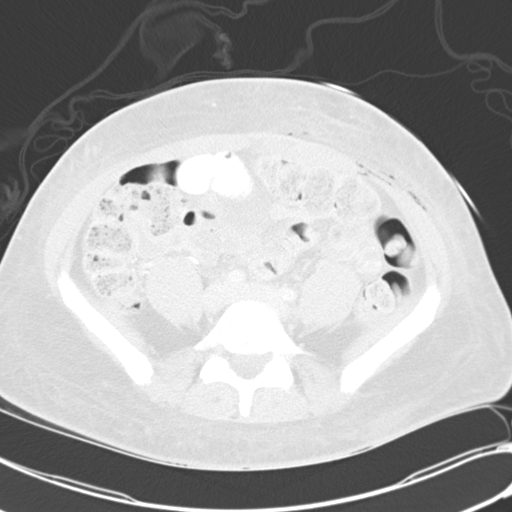
[im 55/91  lung]
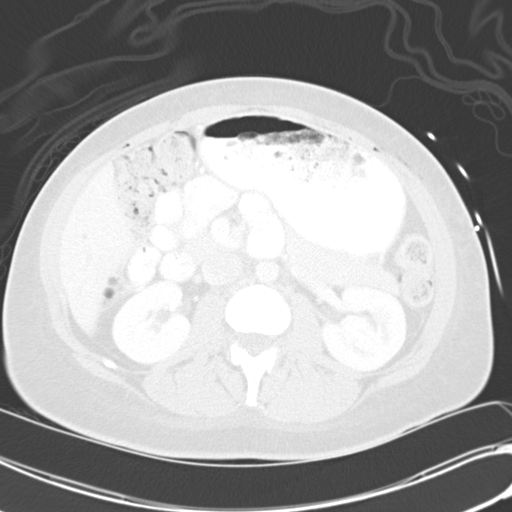
[im 73/91  lung]
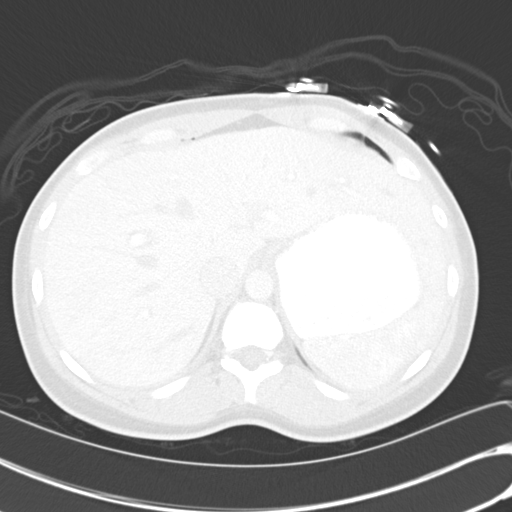

[9 of 46 positions shown; findings below may reference images not displayed]

FINDINGS: CT ANGIOGRAM OF THE CHEST FINDINGS

There are no infiltrates, effusions, masses, adenopathy, pulmonary
emboli, or other significant abnormalities. Heart size is normal. No
osseous abnormality.

CT ABDOMEN FINDINGS

There is hepatomegaly with a prominent left lobe this extends
superior lateral to the spleen. No focal lesions.

Biliary tree, spleen, pancreas, adrenal glands, and kidneys are
normal. The bowel is normal. Appendix is been removed. Uterus and
right ovary have been removed. Left ovary appears normal. Bladder is
distended.

There is air in the anterior abdominal wall and in the subcutaneous
fat in the left mid abdomen. There is also slight edema in the
subcutaneous fat of the pelvis anteriorly and posteriorly. This is
most likely postsurgical in origin.
IMPRESSION: 1.  No acute intra-abdominal abnormality.
2. Air and edema in the anterior abdominal wall and subcutaneous fat
to the left of midline in the pelvis and abdomen. This is felt to be
postsurgical in origin.
3. Hepatomegaly.

## 2016-03-21 MED FILL — DEXTROAMP-AMPHETAMIN 20 MG: 20 | 30 days supply | Qty: 30 | Fill #0

## 2016-03-21 MED FILL — ESCITALOPRAM 10 MG TABLET: 10 | 30 days supply | Qty: 30 | Fill #0

## 2016-03-21 MED FILL — DEXTROAMP-AMPHET ER 20 MG C: 20 | 30 days supply | Qty: 30 | Fill #0

## 2016-04-16 MED FILL — ESCITALOPRAM 10 MG TABLET: 10 | 30 days supply | Qty: 45 | Fill #0

## 2016-04-18 MED FILL — LORazepam 0.5 MG TABS: 0.5 | 10 days supply | Qty: 30 | Fill #0

## 2016-04-23 MED FILL — DEXTROAMP-AMPHET ER 20 MG C: 20 | 30 days supply | Qty: 30 | Fill #0

## 2016-04-23 MED FILL — DEXTROAMP-AMPHETAMIN 20 MG: 20 | 30 days supply | Qty: 30 | Fill #0

## 2016-05-16 MED FILL — ESCITALOPRAM 10 MG TABLET: 10 | 30 days supply | Qty: 45 | Fill #1

## 2016-05-26 MED FILL — DEXTROAMP-AMPHETAMIN 20 MG: 20 | 30 days supply | Qty: 30 | Fill #0

## 2016-05-26 MED FILL — DEXTROAMP-AMPHET ER 20 MG C: 20 | 30 days supply | Qty: 30 | Fill #0

## 2016-06-12 DIAGNOSIS — F419 Anxiety disorder, unspecified: Secondary | ICD-10-CM | POA: Diagnosis not present

## 2016-06-12 DIAGNOSIS — G479 Sleep disorder, unspecified: Secondary | ICD-10-CM | POA: Diagnosis not present

## 2016-06-12 MED FILL — traZODone HCL 50 MG TABS: 50 | 30 days supply | Qty: 30 | Fill #0

## 2016-06-13 ENCOUNTER — Other Ambulatory Visit: Payer: Self-pay | Admitting: Physician Assistant

## 2016-06-13 DIAGNOSIS — Z1231 Encounter for screening mammogram for malignant neoplasm of breast: Secondary | ICD-10-CM

## 2016-06-16 MED FILL — ESCITALOPRAM 10 MG TABLET: 10 | 30 days supply | Qty: 45 | Fill #2

## 2016-06-26 MED FILL — DEXTROAMP-AMPHETAMIN 20 MG: 20 | 30 days supply | Qty: 30 | Fill #0

## 2016-06-26 MED FILL — DEXTROAMP-AMPHET ER 20 MG C: 20 | 30 days supply | Qty: 30 | Fill #0

## 2016-07-07 ENCOUNTER — Ambulatory Visit: Payer: Self-pay

## 2016-07-10 DIAGNOSIS — N951 Menopausal and female climacteric states: Secondary | ICD-10-CM | POA: Diagnosis not present

## 2016-07-30 MED FILL — AMPHETAMINE SALTS 20 MG TAB: 20 | 30 days supply | Qty: 30 | Fill #0

## 2016-07-30 MED FILL — DEXTROAMP-AMPHET ER 20 MG C: 20 | 30 days supply | Qty: 30 | Fill #0

## 2016-07-30 MED FILL — ESCITALOPRAM 10 MG TABLET: 10 | 30 days supply | Qty: 45 | Fill #3

## 2016-08-14 MED FILL — AMOXICILLIN 875 MG TABLET: 875 | 10 days supply | Qty: 20 | Fill #0

## 2016-08-15 ENCOUNTER — Emergency Department (HOSPITAL_COMMUNITY)
Admission: EM | Admit: 2016-08-15 | Discharge: 2016-08-15 | Disposition: A | Discharge status: Home / Self Care | Payer: 59 | Attending: Emergency Medicine | Admitting: Emergency Medicine

## 2016-08-15 ENCOUNTER — Emergency Department (HOSPITAL_COMMUNITY): Payer: 59

## 2016-08-15 ENCOUNTER — Encounter (HOSPITAL_COMMUNITY): Payer: Self-pay

## 2016-08-15 DIAGNOSIS — K047 Periapical abscess without sinus: Secondary | ICD-10-CM | POA: Diagnosis not present

## 2016-08-15 DIAGNOSIS — R220 Localized swelling, mass and lump, head: Secondary | ICD-10-CM | POA: Diagnosis not present

## 2016-08-15 LAB — I-STAT CHEM 8, ED
BUN: 10 mg/dL (ref 6–20)
CALCIUM ION: 1.16 mmol/L (ref 1.15–1.40)
Chloride: 102 mmol/L (ref 101–111)
Creatinine, Ser: 0.8 mg/dL (ref 0.44–1.00)
GLUCOSE: 85 mg/dL (ref 65–99)
HCT: 41 % (ref 36.0–46.0)
HEMOGLOBIN: 13.9 g/dL (ref 12.0–15.0)
Potassium: 4.1 mmol/L (ref 3.5–5.1)
Sodium: 140 mmol/L (ref 135–145)
TCO2: 26 mmol/L (ref 0–100)

## 2016-08-15 LAB — I-STAT BETA HCG BLOOD, ED (MC, WL, AP ONLY)

## 2016-08-15 MED ORDER — MORPHINE SULFATE (PF) 4 MG/ML IV SOLN
4.0000 mg | Freq: Once | INTRAVENOUS | Status: AC
  Administered 2016-08-15: 4 mg via INTRAVENOUS
  Filled 2016-08-15: qty 1

## 2016-08-15 MED ORDER — CLINDAMYCIN HCL 300 MG PO CAPS: 300.0000 mg | ORAL_CAPSULE | Freq: Three times a day (TID) | ORAL | 0 refills | Status: AC

## 2016-08-15 MED ORDER — OXYCODONE-ACETAMINOPHEN 5-325 MG PO TABS: 2.0000 | ORAL_TABLET | ORAL | 0 refills | Status: DC | PRN

## 2016-08-15 MED ORDER — IOPAMIDOL (ISOVUE-300) INJECTION 61%
INTRAVENOUS | Status: AC
  Administered 2016-08-15: 75 mL via INTRAVENOUS
  Filled 2016-08-15: qty 75

## 2016-08-15 MED FILL — CLINDAMYCIN HCL 300 MG CAPS: 300 | 7 days supply | Qty: 21 | Fill #0

## 2016-08-15 MED FILL — OXYCODONE W/APAP 5/325 TAB: 5-325 | 1 days supply | Qty: 7 | Fill #0

## 2016-08-15 NOTE — ED Triage Notes (Signed)
[facility] and she took amoxicillin 2 tabs yesterday and she woke up with swelling to her face, lips , head pressure and rt. Eye is blurred.  Airway intact no resp. Distress noted , no hives noted.

## 2016-08-15 NOTE — Discharge Instructions (Signed)
Please read and follow all provided instructions.  Your diagnoses today include:  1. Dental abscess     Tests performed today include: Vital signs. See below for your results today.   Medications prescribed:  Take as prescribed   Home care instructions:  Follow any educational materials contained in this packet.  Follow-up instructions: Please follow-up with your Dentist for further evaluation of symptoms and treatment   Return instructions:  Please return to the Emergency Department if you do not get better, if you get worse, or new symptoms OR  - Fever (temperature greater than 101.5F)  - Bleeding that does not stop with holding pressure to the area    -Severe pain (please note that you may be more sore the day after your accident)  - Chest Pain  - Difficulty breathing  - Severe nausea or vomiting  - Inability to tolerate food and liquids  - Passing out  - Skin becoming red around your wounds  - Change in mental status (confusion or lethargy)  - New numbness or weakness    Please return if you have any other emergent concerns.  Additional Information:  Your vital signs today were: BP 114/75    Pulse 62    Temp 98.7 F (37.1 C) (Oral)    Resp 20    Ht 5' (1.524 m)    Wt 62.6 kg    SpO2 100%    BMI 26.95 kg/m  If your blood pressure (BP) was elevated above 135/85 this visit, please have this repeated by your doctor within one month. ---------------

## 2016-08-15 NOTE — ED Provider Notes (Signed)
[facility] DEPT Provider Note   CSN: [id number] Arrival date & time: 08/15/16  [id number]     History   Chief Complaint Chief Complaint  Patient presents with  . Abscess    HPI Courtney Parsons is a 40 y.o. female.  HPI  40 y.o. female presents to the Emergency Department today complaining of dental abscess since yesterday. Pt states that she notes swelling in front of lip as well as radiation of pain into posterior neck. Notes no fevers at home. No N/V. States that she had an "E visit" with dentist that was unable to see abscess. Rxed her amoxicillin, which she has taken before, for management. Pt took Amoxicillin at 6pm last night. This morning pt states she had increased pain and swelling over area. Pt notes that swelling was present prior to Amoxicillin. No airway compromise. Full ROM of neck. Able to tolerate PO. No other symptoms noted.    Past Medical History:  Diagnosis Date  . ADD (attention deficit disorder)   . Ovarian cyst    h/o    Patient Active Problem List   Diagnosis Date Noted  . HNP (herniated nucleus pulposus), cervical 04/28/2014    Past Surgical History:  Procedure Laterality Date  . ABDOMINAL HYSTERECTOMY    . ANTERIOR CERVICAL DECOMP/DISCECTOMY FUSION N/A 04/28/2014   Procedure: ANTERIOR CERVICAL DECOMPRESSION/DISCECTOMY FUSION 1 LEVEL Cervical six/seven;  Surgeon: Carmela HurtKyle L Cabbell, MD;  Location: [facility];  Service: Neurosurgery;  Laterality: N/A;  . CESAREAN SECTION    . LAPAROSCOPIC OVARIAN CYSTECTOMY Right 01/16/2015   Procedure: LAPAROSCOPIC OVARIAN CYSTECTOMY / EXTENSIVE ABDOMINAL LYSIS OF ADHESIONS;  Surgeon: Ok EdwardsJuan H Fernandez, MD;  Location: [facility];  Service: Gynecology;  Laterality: Right;  . LAPAROSCOPIC UNILATERAL SALPINGECTOMY Right 01/16/2015   Procedure: LAPAROSCOPIC UNILATERAL SALPINGECTOMY;  Surgeon: Ok EdwardsJuan H Fernandez, MD;  Location: [facility];  Service: Gynecology;  Laterality: Right;  . PELVIC LAPAROSCOPY      OB History    Gravida  Para Term Preterm AB Living   1 1 1     1    SAB TAB Ectopic Multiple Live Births                   Home Medications    Prior to Admission medications   Medication Sig Start Date End Date Taking? Authorizing Provider  amphetamine-dextroamphetamine (ADDERALL XR) 20 MG 24 hr capsule Take 20 mg by mouth daily. At 6 am    Historical Provider, MD  amphetamine-dextroamphetamine (ADDERALL) 20 MG tablet Take 20 mg by mouth daily. Takes in the afternoon    Historical Provider, MD  ciprofloxacin (CIPRO) 500 MG tablet Take 1 tablet (500 mg total) by mouth 2 (two) times daily. 12/12/15   Devoria AlbeIva Knapp, MD  cyclobenzaprine (FLEXERIL) 5 MG tablet Take 1 tablet (5 mg total) by mouth 3 (three) times daily as needed (muscle pain). 12/12/15   Devoria AlbeIva Knapp, MD  dexamethasone (DECADRON) 4 MG tablet Take 1 tablet (4 mg total) by mouth 2 (two) times daily with a meal. 09/29/15   Ivery QualeHobson Bryant, PA-C  HYDROcodone-acetaminophen (NORCO/VICODIN) 5-325 MG tablet Take 1 tablet by mouth every 4 (four) hours as needed. 09/29/15   Ivery QualeHobson Bryant, PA-C  ibuprofen (ADVIL,MOTRIN) 800 MG tablet Take 1 tablet (800 mg total) by mouth 3 (three) times daily. 06/14/15   Duane LopeJennifer I Rasch, NP  naproxen (NAPROSYN) 500 MG tablet Take 1 po BID with food prn pain 12/12/15   Devoria AlbeIva Knapp, MD  phenazopyridine (PYRIDIUM) 200 MG tablet Take  1 tablet (200 mg total) by mouth 3 (three) times daily as needed for pain (on urination). 12/12/15   Devoria Albe, MD    Family History Family History  Problem Relation Age of Onset  . Heart disease Mother   . Diabetes Mother   . Hypertension Mother   . Diabetes Maternal Grandmother   . Heart disease Maternal Grandfather   . Heart attack Maternal Grandfather     Social History Social History  Substance Use Topics  . Smoking status: Never Smoker  . Smokeless tobacco: Never Used  . Alcohol use No     Allergies   Dilaudid [hydromorphone hcl] and Cephalexin   Review of Systems Review of Systems ROS  reviewed and all are negative for acute change except as noted in the HPI.  Physical Exam Updated Vital Signs BP 113/71   Pulse 65   Temp 98.7 F (37.1 C) (Oral)   Resp 20   Ht 5' (1.524 m)   Wt 62.6 kg   SpO2 100%   BMI 26.95 kg/m   Physical Exam  Constitutional: She is oriented to person, place, and time. Vital signs are normal. She appears well-developed and well-nourished.  HENT:  Head: Normocephalic.  Right Ear: Hearing normal.  Left Ear: Hearing normal.  Airway intact. Phonating well. FROM of neck without pain or difficulty. Small periapical abscess noted above central incisors. No purulence. Mild lip swelling.   Eyes: Conjunctivae and EOM are normal. Pupils are equal, round, and reactive to light.  Neck: Normal range of motion. Neck supple.  Cardiovascular: Normal rate, regular rhythm, normal heart sounds and intact distal pulses.   Pulmonary/Chest: Effort normal and breath sounds normal.  Musculoskeletal: Normal range of motion.  Neurological: She is alert and oriented to person, place, and time.  Skin: Skin is warm and dry.  Psychiatric: She has a normal mood and affect. Her speech is normal and behavior is normal. Thought content normal.  Nursing note and vitals reviewed.  ED Treatments / Results  Labs (all labs ordered are listed, but only abnormal results are displayed) Labs Reviewed  I-STAT CHEM 8, ED  I-STAT BETA HCG BLOOD, ED ([facility], [facility], [facility] ONLY)   EKG  EKG Interpretation None      Radiology Ct Soft Tissue Neck W Contrast  Result Date: 08/15/2016 CLINICAL DATA:  Soft tissue swelling right upper lip and face. Rule out abscess. EXAM: CT NECK WITH CONTRAST TECHNIQUE: Multidetector CT imaging of the neck was performed using the standard protocol following the bolus administration of intravenous contrast. CONTRAST:  75 ISOVUE-300 IOPAMIDOL (ISOVUE-300) INJECTION 61% COMPARISON:  CT face from today FINDINGS: Pharynx and larynx: Nasopharynx normal. Oropharynx  normal. Larynx normal. Trachea and esophagus normal. Small periapical lucency around the right upper medial incisor. Adjacent to this is a small rim enhancing fluid collection compatible with abscess measuring approximately 3 mm. There is surrounding soft tissue enhancement and cellulitis. There is well-defined extraction site of right upper bicuspid possibly recent tooth extraction. Salivary glands: No inflammation, mass, or stone. Thyroid: Negative Lymph nodes: No enlarged or pathologic lymph nodes. Vascular: Negative Limited intracranial: Negative Visualized orbits: Negative Mastoids and visualized paranasal sinuses: Minimal mucosal edema in the paranasal sinuses. 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 Periapical abscess around right upper incisor as described above Upper chest: Negative Other: None IMPRESSION: Small periapical lucency around right upper medial incisor. Adjacent rim enhancing soft tissue abscess with surrounding cellulitis in the right upper lip. 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 Right upper bicuspid extraction which  may be recent. Electronically Signed   By: Charles  Clark M.D.   On: 08/15/2016 11:27   Ct Maxillofacial W Contrast  Result Date: 08/15/2016 CLINICAL DATA:  Dental abscess.  Lipid facial swelling on the right. EXAM: CT MAXILLOFACIAL WITH CONTRAST TECHNIQUE: Multidetector CT imaging of the maxillofacial structures was performed with intravenous contrast. Multiplanar CT image reconstructions were also generated. A small metallic BB was placed on the right temple in order to reliably differentiate right from left. CONTRAST:  75 ISOVUE-300 IOPAMIDOL (ISOVUE-300) INJECTION 61% COMPARISON:  CT neck from today FINDINGS: Osseous: Small periapical lucency with cortical breakthrough right upper medial incisor. Adjacent to this is a soft tissue 3 mm abscess with surrounding cellulitis. Extraction site right upper bicuspid which may be recent. No other acute osseous abnormality. Orbits:  Negative Sinuses: Mild mucosal edema in the maxillary sinus bilaterally. Remaining sinuses clear. No air-fluid level. Mastoid sinus clear bilaterally. Soft tissues: Soft tissue swelling right upper lip due to cellulitis. Small dental abscess in the soft tissues. Limited intracranial: Negative IMPRESSION: Periapical abscess around right upper medial incisor with cortical destruction and 3 mm soft tissue abscess with surrounding cellulitis in the right upper lip. Electronically Signed   By: Charles  Clark M.D.   On: 08/15/2016 11:30    Procedures Dental Block Date/Time: 08/15/2016 9:06 AM Performed by: Lorina Duffner Authorized by: Haidynn Almendarez   Consent:    Consent obtained:  Verbal   Consent given by:  Patient   Alternatives discussed:  No treatment Indications:    Indications: dental abscess   Location:    Anesthesia block type: periapical. Procedure details (see MAR for exact dosages):    Needle gauge:  25 G   Anesthetic injected:  Bupivacaine 0.5% WITH epi   Injection procedure:  Anatomic landmarks identified Post-procedure details:    Outcome:  Pain relieved   Patient tolerance of procedure:  Tolerated well, no immediate complications   (including critical care time)  Medications Ordered in ED Medications - No data to display   Initial Impression / Assessment and Plan / ED Course  I have reviewed the triage vital signs and the nursing notes.  Pertinent labs & imaging results that were available during my care of the patient were reviewed by me and considered in my medical decision making (see chart for details).  Clinical Course    Final Clinical Impressions(s) / ED Diagnoses  I have reviewed the relevant previous healthcare records. I obtained HPI from historian. Patient discussed with supervising physician  ED Course:  Assessment: Pt is a 40yF  who presents with periapical dental abscess. Took Amoxicillin yesterday via E Visit with dentist. Has taken before. Worsening  swelling this AM per patient with pain. Noted swelling yesterday as well prior to Amoxicillin. Doubt allergic reaction. On exam, pt in NAD. Nontoxic/nonseptic appearing. VSS. Afebrile. Lungs CTA. Heart RRR. Periapical abscess noted No purulence. No airway compromise. FROM of neck without difficulty. Doubt anaphylactic reaction given localized swelling. Previous use of Amoxicillin and administration of ABX several hours ago around 6pm last night. Drained abscess in ED with dental block. Minimal output. Due to conitnued pain and TTP below sinuses. Ordered CT Max for eval of abscess, which showed 75([id number] abscess round right upper middle incisor. Given analgesia in ED. Plan is to DC home with ABX. Will switch to Clindamycin. At time of discharge, Patient is in no acute distress. Vital Signs are stable. Patient is able to ambulate. Patient able to tolerate PO.   Disposition/Plan:  DC Home Additional Verbal discharge instructions given and discussed with patient.  Pt Instructed to f/u with PCP in the next week for evaluation and treatment of symptoms. Return precautions given Pt acknowledges and agrees with plan  Supervising Physician Marily Memos, MD   Final diagnoses:  Dental abscess    New Prescriptions New Prescriptions   No medications on file     Audry Pili, PA-C 08/15/16 1135    Marily Memos, MD 08/16/16 [phone]

## 2016-08-15 NOTE — ED Notes (Signed)
Patient family soda.

## 2016-08-15 NOTE — ED Notes (Signed)
Patient has milld swelling at the lip. PA aware of patient complaint of pain. PA at bedside.

## 2016-08-19 MED FILL — FLUCONAZOLE 150 MG TABLET: 150 | 1 days supply | Qty: 1 | Fill #0

## 2016-09-01 DIAGNOSIS — G479 Sleep disorder, unspecified: Secondary | ICD-10-CM | POA: Diagnosis not present

## 2016-09-01 DIAGNOSIS — F900 Attention-deficit hyperactivity disorder, predominantly inattentive type: Secondary | ICD-10-CM | POA: Diagnosis not present

## 2016-09-01 DIAGNOSIS — F419 Anxiety disorder, unspecified: Secondary | ICD-10-CM | POA: Diagnosis not present

## 2016-09-30 MED FILL — CLINDAMYCIN HCL 150 MG CAP: 150 | 7 days supply | Qty: 56 | Fill #0

## 2016-10-13 MED FILL — AMPHETAMINE SALTS 20 MG TAB: 20 | 30 days supply | Qty: 30 | Fill #0

## 2016-10-13 MED FILL — ADDERALL XR 20 MG CAP SA: 20 | 30 days supply | Qty: 30 | Fill #0

## 2016-11-03 ENCOUNTER — Other Ambulatory Visit: Payer: Self-pay | Admitting: Occupational Medicine

## 2016-11-03 ENCOUNTER — Ambulatory Visit: Payer: Self-pay

## 2016-11-03 DIAGNOSIS — M25512 Pain in left shoulder: Secondary | ICD-10-CM

## 2016-11-21 ENCOUNTER — Other Ambulatory Visit (HOSPITAL_COMMUNITY): Payer: Self-pay | Admitting: Orthopedic Surgery

## 2016-11-21 DIAGNOSIS — M75102 Unspecified rotator cuff tear or rupture of left shoulder, not specified as traumatic: Secondary | ICD-10-CM

## 2016-11-21 MED FILL — MELOXICAM 15 MG TABLET: 15 | 30 days supply | Qty: 30 | Fill #0

## 2016-11-21 MED FILL — ADDERALL XR 20 MG CAP SA: 20 | 30 days supply | Qty: 30 | Fill #0

## 2016-11-21 MED FILL — AMPHETAMINE SALTS 20 MG TAB: 20 | 30 days supply | Qty: 30 | Fill #0

## 2016-11-21 MED FILL — traMADol HCL 50 MG TABS: 50 | 30 days supply | Qty: 60 | Fill #0

## 2016-11-24 ENCOUNTER — Ambulatory Visit (HOSPITAL_COMMUNITY)
Admission: RE | Admit: 2016-11-24 | Discharge: 2016-11-24 | Disposition: A | Discharge status: Home / Self Care | Payer: 59 | Source: Ambulatory Visit | Attending: Orthopedic Surgery | Admitting: Orthopedic Surgery

## 2016-11-24 ENCOUNTER — Ambulatory Visit (HOSPITAL_COMMUNITY)
Admission: RE | Admit: 2016-11-24 | Discharge: 2016-11-24 | Disposition: A | Discharge status: Home / Self Care | Payer: PRIVATE HEALTH INSURANCE | Source: Ambulatory Visit | Attending: Orthopedic Surgery | Admitting: Orthopedic Surgery

## 2016-11-24 DIAGNOSIS — M75102 Unspecified rotator cuff tear or rupture of left shoulder, not specified as traumatic: Secondary | ICD-10-CM | POA: Diagnosis not present

## 2016-11-24 DIAGNOSIS — M25512 Pain in left shoulder: Secondary | ICD-10-CM | POA: Diagnosis not present

## 2016-11-24 MED ORDER — LIDOCAINE HCL 1 % IJ SOLN
INTRAMUSCULAR | Status: AC
  Filled 2016-11-24: qty 10

## 2016-11-24 MED ORDER — GADOBENATE DIMEGLUMINE 529 MG/ML IV SOLN
5.0000 mL | Freq: Once | INTRAVENOUS | Status: AC | PRN
  Administered 2016-11-24: 0.05 mL via INTRA_ARTICULAR

## 2016-11-24 MED ORDER — IOPAMIDOL (ISOVUE-M 200) INJECTION 41%
INTRAMUSCULAR | Status: AC
  Administered 2016-11-24: 6 mL via INTRA_ARTICULAR
  Filled 2016-11-24: qty 10

## 2016-11-24 MED ORDER — IOPAMIDOL (ISOVUE-M 200) INJECTION 41%
20.0000 mL | Freq: Once | INTRAMUSCULAR | Status: AC
  Administered 2016-11-24: 6 mL via INTRA_ARTICULAR

## 2016-11-24 MED ORDER — LIDOCAINE HCL (PF) 1 % IJ SOLN
5.0000 mL | Freq: Once | INTRAMUSCULAR | Status: AC
  Administered 2016-11-24: 5 mL via INTRADERMAL

## 2016-11-30 DIAGNOSIS — R112 Nausea with vomiting, unspecified: Secondary | ICD-10-CM | POA: Diagnosis not present

## 2016-11-30 DIAGNOSIS — Z79899 Other long term (current) drug therapy: Secondary | ICD-10-CM | POA: Diagnosis not present

## 2016-11-30 DIAGNOSIS — R51 Headache: Secondary | ICD-10-CM | POA: Diagnosis not present

## 2016-11-30 DIAGNOSIS — B349 Viral infection, unspecified: Secondary | ICD-10-CM | POA: Diagnosis not present

## 2016-12-01 ENCOUNTER — Ambulatory Visit: Payer: PRIVATE HEALTH INSURANCE | Attending: Orthopedic Surgery | Admitting: Physical Therapy

## 2016-12-01 DIAGNOSIS — M25512 Pain in left shoulder: Secondary | ICD-10-CM | POA: Insufficient documentation

## 2016-12-01 DIAGNOSIS — M25612 Stiffness of left shoulder, not elsewhere classified: Secondary | ICD-10-CM | POA: Diagnosis present

## 2016-12-01 NOTE — Therapy (Signed)
[facility] [address] [place], [place], [address] Phone: [phone]   Fax:  [phone]  Physical Therapy Evaluation  Patient Details  Name: Courtney Parsons MRN: [id number] Date of Birth: March 24, 1976 Referring Provider: Gean Birchwood MD.  Encounter Date: 12/01/2016      PT End of Session - 12/01/16 1229    Visit Number 1   Number of Visits 12   Date for PT Re-Evaluation 01/12/17   PT Start Time 0903   PT Stop Time 0940   PT Time Calculation (min) 37 min   Activity Tolerance Patient limited by pain   Behavior During Therapy Restless      Past Medical History:  Diagnosis Date  . ADD (attention deficit disorder)   . Ovarian cyst    h/o    Past Surgical History:  Procedure Laterality Date  . ABDOMINAL HYSTERECTOMY    . ANTERIOR CERVICAL DECOMP/DISCECTOMY FUSION N/A 04/28/2014   Procedure: ANTERIOR CERVICAL DECOMPRESSION/DISCECTOMY FUSION 1 LEVEL Cervical six/seven;  Surgeon: Carmela Hurt, MD;  Location: [facility];  Service: Neurosurgery;  Laterality: N/A;  . CESAREAN SECTION    . LAPAROSCOPIC OVARIAN CYSTECTOMY Right 01/16/2015   Procedure: LAPAROSCOPIC OVARIAN CYSTECTOMY / EXTENSIVE ABDOMINAL LYSIS OF ADHESIONS;  Surgeon: Ok Edwards, MD;  Location: [facility];  Service: Gynecology;  Laterality: Right;  . LAPAROSCOPIC UNILATERAL SALPINGECTOMY Right 01/16/2015   Procedure: LAPAROSCOPIC UNILATERAL SALPINGECTOMY;  Surgeon: Ok Edwards, MD;  Location: [facility];  Service: Gynecology;  Laterality: Right;  . PELVIC LAPAROSCOPY      There were no vitals filed for this visit.       Subjective Assessment - 12/01/16 1232    Subjective The patient presents to OPPT with a diagnosis of left shoulder impingement.  She sustained injury to her left shoulder on 10/07/16  when she was getting out of her car at work and slipped on black ice and falling directly onto her left shoulder.  She states the pain was so bad yesterday that she  presented to an ED.  She rates her pain at an 8/10 abd 10+/10 with movement od her left shoulder.  The patient sttaes she can no longer jog because of her left shoulder.            [facility] PT Assessment - 12/01/16 0001      Assessment   Medical Diagnosis Left shouler impingement.   Referring Provider Gean Birchwood MD.   Onset Date/Surgical Date --  10/07/16.     Precautions   Precautions None     Restrictions   Weight Bearing Restrictions No     Balance Screen   Has the patient fallen in the past 6 months Yes   How many times? --  1.   Has the patient had a decrease in activity level because of a fear of falling?  Yes   Is the patient reluctant to leave their home because of a fear of falling?  No     Home Environment   Living Environment Private residence     Prior Function   Level of Independence Independent     Posture/Postural Control   Posture Comments Very guraded pain posture holding with arm close to side.     ROM / Strength   AROM / PROM / Strength PROM;Strength     PROM   Overall PROM Comments In supine performed very gentle PROM to patient's left shoulder which was limited to 40 degrees of flexion and ER to  42 degrees.  Patient was in writhing pain with only gentle PROM.     Strength   Overall Strength Comments Not able to test pain due to c/o high pain-level.     Palpation   Palpation comment Tender to palpation in left Rhomboid region near left medial scapular border.   She also has complaints at the left acromial ridge.  C/o pain also over left UT.     Special Tests    Special Tests --  Normal left UE DTR's.     Ambulation/Gait   Gait Comments WNL.                   [facility] Adult PT Treatment/Exercise - 12/01/16 0001      Modalities   Modalities Electrical Stimulation     Electrical Stimulation   Electrical Stimulation Action Attempting low-level pre-mod (5 sec on and 5 sec 760ff) at output 4 but after a couple of minutes it was removed as  the patient states it was too painful.                     PT Long Term Goals - 12/01/16 1252      PT LONG TERM GOAL #1   Title Independent with a HEP.   Time 6   Period Weeks   Status New     PT LONG TERM GOAL #2   Title Active left shoulder flexion to 155 degrees so the patient can easily reach overhead   Time 6   Period Weeks   Status New     PT LONG TERM GOAL #3   Title Active ER to 80 degrees+ to allow for easily donning/doffing of apparel   Time 6   Period Weeks   Status New     PT LONG TERM GOAL #4   Title Increase ROM so patient is able to reach behind back to L2 with left hand.   Time 6   Period Weeks   Status New     PT LONG TERM GOAL #5   Title Increase left shoulder strength to a solid 4+/5 to increase stability for performance of functional activities.   Time 6   Period Weeks   Status New     Additional Long Term Goals   Additional Long Term Goals Yes     PT LONG TERM GOAL #6   Title Perform ADL's with pain not > 3/10.   Time 6   Period Weeks   Status New               Plan - 12/01/16 1251    PT Next Visit Plan ROM to left shoulder.  Try STW/M and US.  Please establish a HEP.      Patient will benefit from skilled therapeutic intervention in order to improve the following deficits and impairments:  Pain, Decreased activity tolerance, Decreased range of motion  Visit Diagnosis: Acute pain of left shoulder - Plan: PT plan of care cert/re-cert  Stiffness of left shoulder, not elsewhere classified - Plan: PT plan of care cert/re-cert     Problem List Patient Active Problem List   Diagnosis Date Noted  . HNP (herniated nucleus pulposus), cervical 04/28/2014    Courtney Parsons, [place] MPT 12/01/2016, 12:56 PM  [facility] [address] [place], [place], [address] Phone: [phone]   Fax:  [phone]  Name: Courtney Parsons MRN: [id number] Date of Birth: 07/21/1976

## 2016-12-01 NOTE — Patient Instructions (Addendum)
Instructed patient in supine active-assistive left shoulder ROM with patient using right hand to grasp legft wrist and supine cane exercise to increase ER.  Please give pictures of exercises to patient next visit.  Explained to patient the importance of compliance to her HEP to avoid a frozen shoulder.

## 2016-12-02 ENCOUNTER — Other Ambulatory Visit (HOSPITAL_COMMUNITY): Payer: Self-pay

## 2016-12-02 ENCOUNTER — Ambulatory Visit (HOSPITAL_COMMUNITY): Payer: 59

## 2016-12-04 ENCOUNTER — Ambulatory Visit: Payer: PRIVATE HEALTH INSURANCE | Admitting: Physical Therapy

## 2016-12-04 ENCOUNTER — Encounter: Payer: Self-pay | Admitting: Physical Therapy

## 2016-12-04 DIAGNOSIS — M25512 Pain in left shoulder: Secondary | ICD-10-CM

## 2016-12-04 DIAGNOSIS — M25612 Stiffness of left shoulder, not elsewhere classified: Secondary | ICD-10-CM

## 2016-12-04 NOTE — Therapy (Signed)
[facility] [address] [place], [place], [address] Phone: [phone]   Fax:  [phone]  Physical Therapy Treatment  Patient Details  Name: Courtney Cloudammy L Ferns MRN: [id number] Date of Birth: 02/19/1976 Referring Provider: Gean BirchwoodFrank Rowan MD.  Encounter Date: 12/04/2016      PT End of Session - 12/04/16 1642    Visit Number 2   Number of Visits 12   Date for PT Re-Evaluation 01/12/17   PT Start Time 1649   PT Stop Time 1734   PT Time Calculation (min) 45 min   Activity Tolerance Patient limited by pain   Behavior During Therapy Restless      Past Medical History:  Diagnosis Date  . ADD (attention deficit disorder)   . Ovarian cyst    h/o    Past Surgical History:  Procedure Laterality Date  . ABDOMINAL HYSTERECTOMY    . ANTERIOR CERVICAL DECOMP/DISCECTOMY FUSION N/A 04/28/2014   Procedure: ANTERIOR CERVICAL DECOMPRESSION/DISCECTOMY FUSION 1 LEVEL Cervical six/seven;  Surgeon: Carmela HurtKyle L Cabbell, MD;  Location: [facility];  Service: Neurosurgery;  Laterality: N/A;  . CESAREAN SECTION    . LAPAROSCOPIC OVARIAN CYSTECTOMY Right 01/16/2015   Procedure: LAPAROSCOPIC OVARIAN CYSTECTOMY / EXTENSIVE ABDOMINAL LYSIS OF ADHESIONS;  Surgeon: Ok EdwardsJuan H Fernandez, MD;  Location: [facility];  Service: Gynecology;  Laterality: Right;  . LAPAROSCOPIC UNILATERAL SALPINGECTOMY Right 01/16/2015   Procedure: LAPAROSCOPIC UNILATERAL SALPINGECTOMY;  Surgeon: Ok EdwardsJuan H Fernandez, MD;  Location: [facility];  Service: Gynecology;  Laterality: Right;  . PELVIC LAPAROSCOPY      There were no vitals filed for this visit.      Subjective Assessment - 12/04/16 1642    Subjective Reports that her shoulder is the same. Has slept on a heating pad for two nights per patient report. States that she doesn't know whether the pain is from her neck or shoulder. States that she has a very painful spot in L shoulder blade region. Reports that at times her L hand will go numb and white  and has pain into L fourth and fifth phalanges. Reports that at times she has vomited due to intense L shoulder and neck pain. Patient reports that no scans have been done to her cervical spine and MRI not complete as she was unable to raise her L arm as it needed to be. States that she has tried exercise that PT showed her an exercise but states that exercise is painful.   Pertinent History Cervical fusion C6-7   Diagnostic tests MRI.   Patient Stated Goals Get out of pain.  I want to jog again.   Currently in Pain? Yes   Pain Score 6    Pain Location Shoulder   Pain Orientation Left   Pain Descriptors / Indicators Aching;Sharp   Pain Type Acute pain   Pain Onset More than a month ago   Pain Frequency Constant            [facility] PT Assessment - 12/04/16 0001      Assessment   Medical Diagnosis Left shouler impingement.   Onset Date/Surgical Date 10/07/16     Precautions   Precautions None     Restrictions   Weight Bearing Restrictions No                     [facility] Adult PT Treatment/Exercise - 12/04/16 0001      Modalities   Modalities Moist Heat;Ultrasound     Moist Heat Therapy  Number Minutes Moist Heat 15 Minutes   Moist Heat Location Shoulder     Ultrasound   Ultrasound Location L UT   Ultrasound Parameters 1.5 w/cm2, 100%, 1 mhz x10 min   Ultrasound Goals Pain;Edema     Manual Therapy   Manual Therapy Soft tissue mobilization;Passive ROM   Soft tissue mobilization Gentle STW to L UT, cervical paraspinals, Levator Scapula to reduce tone and pain in sitting; Gentle STW to L Deltoids to reduce tone and pain following increase in pain during PROM    Passive ROM Attempted PROM of L shoulder into flexion but unable to secondary to intense pain reported by patient                     PT Long Term Goals - 12/01/16 1252      PT LONG TERM GOAL #1   Title Independent with a HEP.   Time 6   Period Weeks   Status New     PT LONG TERM GOAL  #2   Title Active left shoulder flexion to 155 degrees so the patient can easily reach overhead   Time 6   Period Weeks   Status New     PT LONG TERM GOAL #3   Title Active ER to 80 degrees+ to allow for easily donning/doffing of apparel   Time 6   Period Weeks   Status New     PT LONG TERM GOAL #4   Title Increase ROM so patient is able to reach behind back to L2 with left hand.   Time 6   Period Weeks   Status New     PT LONG TERM GOAL #5   Title Increase left shoulder strength to a solid 4+/5 to increase stability for performance of functional activities.   Time 6   Period Weeks   Status New     Additional Long Term Goals   Additional Long Term Goals Yes     PT LONG TERM GOAL #6   Title Perform ADL's with pain not > 3/10.   Time 6   Period Weeks   Status New               Plan - 12/04/16 1727    Clinical Impression Statement Patient presented in clinic with complaint of continued L shoulder and neck pain. Patient was continually cautioned throughout treatment of preventing adhesive capsulitis by completing HEP. Increased tone and inflammation noted in L UT and cervical paraspinals upon observation and comparison to R UT region. STW completed gently as patient had pain and tenderness with light STW intermitantly. PROM of L shoulder attempted in gentle manner but discontinued very soon after initiation as patient reported L hand numbness, clicking in her neck and increased pain. Increased tone noted in patient's L deltoids region upon palpation. Moist heat applied to L shoulder in efforts to assist in L shoulder pain. Patient encouraged to return to MD at next visit and PT would complete treatment as symptoms allowed. PTA demonstrated a gentle L UT stretch for patient and encouraged patient to try stretch but to stop if any pain ellicited.   Rehab Potential Fair   PT Frequency 2x / week   PT Duration 6 weeks   PT Treatment/Interventions ADLs/Self Care Home  Management;Moist Heat;Electrical Stimulation;Ultrasound;Patient/family education;Therapeutic exercise;Therapeutic activities;Manual techniques;Passive range of motion;Vasopneumatic Device   PT Next Visit Plan ROM to left shoulder.  Try STW/M and [place].  Please establish a HEP.   Consulted  and Agree with Plan of Care Patient      Patient will benefit from skilled therapeutic intervention in order to improve the following deficits and impairments:  Pain, Decreased activity tolerance, Decreased range of motion  Visit Diagnosis: Acute pain of left shoulder  Stiffness of left shoulder, not elsewhere classified     Problem List Patient Active Problem List   Diagnosis Date Noted  . HNP (herniated nucleus pulposus), cervical 04/28/2014    Evelene Croon, PTA 12/04/2016, 5:45 PM  [facility] [address] [place], [place], [address] Phone: [phone]   Fax:  [phone]  Name: Courtney Parsons MRN: [id number] Date of Birth: 12/06/1975

## 2016-12-09 ENCOUNTER — Ambulatory Visit: Payer: PRIVATE HEALTH INSURANCE | Admitting: Physical Therapy

## 2016-12-11 ENCOUNTER — Ambulatory Visit: Payer: PRIVATE HEALTH INSURANCE | Admitting: Physical Therapy

## 2016-12-11 ENCOUNTER — Encounter: Payer: Self-pay | Admitting: Physical Therapy

## 2016-12-11 DIAGNOSIS — M25512 Pain in left shoulder: Secondary | ICD-10-CM | POA: Diagnosis not present

## 2016-12-11 DIAGNOSIS — M25612 Stiffness of left shoulder, not elsewhere classified: Secondary | ICD-10-CM

## 2016-12-11 NOTE — Therapy (Signed)
[facility] [address] [place], [place], [address] Phone: [phone]   Fax:  [phone]  Physical Therapy Treatment  Patient Details  Name: LANEE CHAIN MRN: [id number] Date of Birth: [birth year] Referring Provider: Gean Birchwood MD.  Encounter Date: 12/11/2016      PT End of Session - 12/11/16 1738    Visit Number 3   Number of Visits 12   Date for PT Re-Evaluation 01/12/17   PT Start Time 1648   PT Stop Time 1738   PT Time Calculation (min) 50 min   Activity Tolerance Patient limited by pain;Patient tolerated treatment well   Behavior During Therapy Restless      Past Medical History:  Diagnosis Date  . ADD (attention deficit disorder)   . Ovarian cyst    h/o    Past Surgical History:  Procedure Laterality Date  . ABDOMINAL HYSTERECTOMY    . ANTERIOR CERVICAL DECOMP/DISCECTOMY FUSION N/A 04/28/2014   Procedure: ANTERIOR CERVICAL DECOMPRESSION/DISCECTOMY FUSION 1 LEVEL Cervical six/seven;  Surgeon: Carmela Hurt, MD;  Location: [facility];  Service: Neurosurgery;  Laterality: N/A;  . CESAREAN SECTION    . LAPAROSCOPIC OVARIAN CYSTECTOMY Right 01/16/2015   Procedure: LAPAROSCOPIC OVARIAN CYSTECTOMY / EXTENSIVE ABDOMINAL LYSIS OF ADHESIONS;  Surgeon: Ok Edwards, MD;  Location: [facility];  Service: Gynecology;  Laterality: Right;  . LAPAROSCOPIC UNILATERAL SALPINGECTOMY Right 01/16/2015   Procedure: LAPAROSCOPIC UNILATERAL SALPINGECTOMY;  Surgeon: Ok Edwards, MD;  Location: [facility];  Service: Gynecology;  Laterality: Right;  . PELVIC LAPAROSCOPY      There were no vitals filed for this visit.      Subjective Assessment - 12/11/16 1733    Subjective Reports that nothing really came from MD appointment as she cannot have an MRI until she talks with adjuster and she has not talked to the adjuster in her attempts. States that she may get a Clinical research associate. Reports that she has been trying to move her shoulder around and  sees an improvement.   Pertinent History Cervical fusion C6-7   Diagnostic tests MRI.   Patient Stated Goals Get out of pain.  I want to jog again.   Currently in Pain? Yes   Pain Score 7    Pain Location Neck   Pain Descriptors / Indicators Tightness   Pain Type Acute pain   Pain Onset More than a month ago   Multiple Pain Sites Yes   Pain Score 6   Pain Location Shoulder   Pain Orientation Left   Pain Descriptors / Indicators Discomfort            [facility] PT Assessment - 12/11/16 0001      Assessment   Medical Diagnosis Left shouler impingement.   Onset Date/Surgical Date 10/07/16   Next MD Visit 01/01/2017     Precautions   Precautions None     Restrictions   Weight Bearing Restrictions No                     [facility] Adult PT Treatment/Exercise - 12/11/16 0001      Modalities   Modalities Moist Heat;Ultrasound     Moist Heat Therapy   Number Minutes Moist Heat 15 Minutes   Moist Heat Location Shoulder     Ultrasound   Ultrasound Location L UT   Ultrasound Parameters 1.5 w/cm2, 100%, 1 mhz x10 min   Ultrasound Goals Pain;Edema     Manual Therapy   Manual Therapy Soft tissue  mobilization;Passive ROM   Soft tissue mobilization Gentle STW to L UT, Levator Scapula to reduce tone and pain in sitting; Gentle STW to L Deltoids to reduce tone and pain following increase in pain during PROM    Passive ROM PROM of L shoulder into flexion to promote ROM but stopped secondary to continuous reports of L shoulder discomfort and AC joint discomfort                     PT Long Term Goals - 12/01/16 1252      PT LONG TERM GOAL #1   Title Independent with a HEP.   Time 6   Period Weeks   Status New     PT LONG TERM GOAL #2   Title Active left shoulder flexion to 155 degrees so the patient can easily reach overhead   Time 6   Period Weeks   Status New     PT LONG TERM GOAL #3   Title Active ER to 80 degrees+ to allow for easily donning/doffing  of apparel   Time 6   Period Weeks   Status New     PT LONG TERM GOAL #4   Title Increase ROM so patient is able to reach behind back to L2 with left hand.   Time 6   Period Weeks   Status New     PT LONG TERM GOAL #5   Title Increase left shoulder strength to a solid 4+/5 to increase stability for performance of functional activities.   Time 6   Period Weeks   Status New     Additional Long Term Goals   Additional Long Term Goals Yes     PT LONG TERM GOAL #6   Title Perform ADL's with pain not > 3/10.   Time 6   Period Weeks   Status New               Plan - 12/11/16 1740    Clinical Impression Statement Patient presented in clinic today with continued neck and L shoulder pain. Patient continues to have greater tone in L UT/ Levator Scapula region upon palpation when compared to L musculature. Patient continues to report discomfort and demonstrate facial grimacing with STW to L UT region. Patient able to tolerate very gentle PROM of L shoulder into flexion for longer period of time today but continued to experience pain at end range as well as pain in posterioproximal L humerus and around [facility] joint region. Tightness of L deltoids region also palpated and gentle STW completed as well. Overall minimal release noted following manual therapy to L UT/ Levator Scapula region as well as L deltoids. Normal modalities response noted following removal of the modalities. No adverse reports from patient following end of treatment.   Rehab Potential Fair   PT Frequency 2x / week   PT Duration 6 weeks   PT Treatment/Interventions ADLs/Self Care Home Management;Moist Heat;Electrical Stimulation;Ultrasound;Patient/family education;Therapeutic exercise;Therapeutic activities;Manual techniques;Passive range of motion;Vasopneumatic Device   PT Next Visit Plan ROM to left shoulder.  Try STW/M and US.  Please establish a HEP.   Consulted and Agree with Plan of Care Patient      Patient will  benefit from skilled therapeutic intervention in order to improve the following deficits and impairments:  Pain, Decreased activity tolerance, Decreased range of motion  Visit Diagnosis: Acute pain of left shoulder  Stiffness of left shoulder, not elsewhere classified     Problem List Patient Active  Problem List   Diagnosis Date Noted  . HNP (herniated nucleus pulposus), cervical 04/28/2014    Evelene Croon, PTA 12/11/2016, 5:52 PM  [facility] [address] [place], [place], [address] Phone: [phone]   Fax:  [phone]  Name: MYKIA HOLTON MRN: [id number] Date of Birth: [birth year]

## 2016-12-16 ENCOUNTER — Encounter: Payer: Self-pay | Admitting: Physical Therapy

## 2016-12-16 ENCOUNTER — Ambulatory Visit: Payer: PRIVATE HEALTH INSURANCE | Admitting: Physical Therapy

## 2016-12-16 DIAGNOSIS — M25512 Pain in left shoulder: Secondary | ICD-10-CM | POA: Diagnosis not present

## 2016-12-16 DIAGNOSIS — M25612 Stiffness of left shoulder, not elsewhere classified: Secondary | ICD-10-CM

## 2016-12-16 NOTE — Therapy (Signed)
[facility] [address] [place], [place], [address] Phone: [phone]   Fax:  [phone]  Physical Therapy Treatment  Patient Details  Name: SHAKINA CHOY MRN: [id number] Date of Birth: [birth year] Referring Provider: Gean Birchwood MD.  Encounter Date: 12/16/2016      PT End of Session - 12/16/16 1649    Visit Number 4   Number of Visits 12   Date for PT Re-Evaluation 01/12/17   PT Start Time 1649  2 units secondary to use of moist heat   PT Stop Time 1731   PT Time Calculation (min) 42 min   Activity Tolerance Patient limited by pain   Behavior During Therapy Restless      Past Medical History:  Diagnosis Date  . ADD (attention deficit disorder)   . Ovarian cyst    h/o    Past Surgical History:  Procedure Laterality Date  . ABDOMINAL HYSTERECTOMY    . ANTERIOR CERVICAL DECOMP/DISCECTOMY FUSION N/A 04/28/2014   Procedure: ANTERIOR CERVICAL DECOMPRESSION/DISCECTOMY FUSION 1 LEVEL Cervical six/seven;  Surgeon: Carmela Hurt, MD;  Location: [facility];  Service: Neurosurgery;  Laterality: N/A;  . CESAREAN SECTION    . LAPAROSCOPIC OVARIAN CYSTECTOMY Right 01/16/2015   Procedure: LAPAROSCOPIC OVARIAN CYSTECTOMY / EXTENSIVE ABDOMINAL LYSIS OF ADHESIONS;  Surgeon: Ok Edwards, MD;  Location: [facility];  Service: Gynecology;  Laterality: Right;  . LAPAROSCOPIC UNILATERAL SALPINGECTOMY Right 01/16/2015   Procedure: LAPAROSCOPIC UNILATERAL SALPINGECTOMY;  Surgeon: Ok Edwards, MD;  Location: [facility];  Service: Gynecology;  Laterality: Right;  . PELVIC LAPAROSCOPY      There were no vitals filed for this visit.      Subjective Assessment - 12/16/16 1648    Subjective Reports that this is her second bad day in a row. Reports not sleeping well last night. Reports "almost daily" headaches that she knows comes from her neck as she states if she applies pressure to her neck during headache that a sharp pain occurs. Patient  also reports numbness and cold sensation in L second through fourth phalanges.   Pertinent History Cervical fusion C6-7   Diagnostic tests MRI.   Patient Stated Goals Get out of pain.  I want to jog again.   Currently in Pain? Yes   Pain Score 8    Pain Location Neck   Pain Orientation Left   Pain Descriptors / Indicators Aching   Pain Type Acute pain   Pain Onset More than a month ago            [facility] PT Assessment - 12/16/16 0001      Assessment   Medical Diagnosis Left shouler impingement.   Onset Date/Surgical Date 10/07/16   Next MD Visit 01/01/2017     Precautions   Precautions None     Restrictions   Weight Bearing Restrictions No                     [facility] Adult PT Treatment/Exercise - 12/16/16 0001      Modalities   Modalities Moist Heat;Ultrasound     Moist Heat Therapy   Number Minutes Moist Heat 15 Minutes   Moist Heat Location Cervical;Shoulder     Ultrasound   Ultrasound Location L UT   Ultrasound Parameters 1.5 w/cm2, 100%, 1 mhz x10 min   Ultrasound Goals Pain     Manual Therapy   Manual Therapy Soft tissue mobilization;Passive ROM   Soft tissue mobilization Gentle STW to L  UT, Levator Scapula to reduce tone and pain in sitting; Gentle STW to L Deltoids/ Bicep to reduce tone and pain following increase in pain during PROM    Passive ROM Attempted PROM of L shoulder into flexion although stopped secondary to pain                     PT Long Term Goals - 12/01/16 1252      PT LONG TERM GOAL #1   Title Independent with a HEP.   Time 6   Period Weeks   Status New     PT LONG TERM GOAL #2   Title Active left shoulder flexion to 155 degrees so the patient can easily reach overhead   Time 6   Period Weeks   Status New     PT LONG TERM GOAL #3   Title Active ER to 80 degrees+ to allow for easily donning/doffing of apparel   Time 6   Period Weeks   Status New     PT LONG TERM GOAL #4   Title Increase ROM so patient  is able to reach behind back to L2 with left hand.   Time 6   Period Weeks   Status New     PT LONG TERM GOAL #5   Title Increase left shoulder strength to a solid 4+/5 to increase stability for performance of functional activities.   Time 6   Period Weeks   Status New     Additional Long Term Goals   Additional Long Term Goals Yes     PT LONG TERM GOAL #6   Title Perform ADL's with pain not > 3/10.   Time 6   Period Weeks   Status New               Plan - 12/16/16 1722    Clinical Impression Statement Patient presented in clinic with continued high level cervical pain that radiates down LUE. Patient continues to present with increased tone throughout L UT region and increased sensitivity and pain with palpation during manual therapy. Pain continued increasing as manual therapy in sitting thus manual therapy to L UT ceased. Patient unable to tolerate PROM of L shoulder past approximately 45 degrees in supine. Increased tone also palpated throughout L deltoids and bicep region. Patient also tender to palpation along L Bicep and Deltoids and at coracoid process. "Almost daily" headaches are experienced by patient as well as numbness along second through fourth phalanges. Moist heat applied to cervical musculature and L shoulder with normal response. Patient was encouraged to use heating pad at home as well as self massage if tolerable.   Rehab Potential Fair   PT Frequency 2x / week   PT Duration 6 weeks   PT Treatment/Interventions ADLs/Self Care Home Management;Moist Heat;Electrical Stimulation;Ultrasound;Patient/family education;Therapeutic exercise;Therapeutic activities;Manual techniques;Passive range of motion;Vasopneumatic Device   PT Next Visit Plan ROM to left shoulder.  Try STW/M and US.  Please establish a HEP.   Consulted and Agree with Plan of Care Patient      Patient will benefit from skilled therapeutic intervention in order to improve the following deficits and  impairments:  Pain, Decreased activity tolerance, Decreased range of motion  Visit Diagnosis: Acute pain of left shoulder  Stiffness of left shoulder, not elsewhere classified     Problem List Patient Active Problem List   Diagnosis Date Noted  . HNP (herniated nucleus pulposus), cervical 04/28/2014    Evelene CroonKelsey M Parsons, PTA 12/16/2016,  5:43 PM  [facility] [address] [place], [place], [address] Phone: [phone]   Fax:  [phone]  Name: ATHENE SCHUHMACHER MRN: [id number] Date of Birth: 23-May-1976

## 2016-12-18 ENCOUNTER — Ambulatory Visit: Payer: PRIVATE HEALTH INSURANCE | Admitting: Physical Therapy

## 2016-12-18 ENCOUNTER — Encounter: Payer: Self-pay | Admitting: Physical Therapy

## 2016-12-18 DIAGNOSIS — M25612 Stiffness of left shoulder, not elsewhere classified: Secondary | ICD-10-CM

## 2016-12-18 DIAGNOSIS — M25512 Pain in left shoulder: Secondary | ICD-10-CM

## 2016-12-18 NOTE — Therapy (Signed)
[facility] [address] [place], [place], [address] Phone: [phone]   Fax:  [phone]  Physical Therapy Treatment  Patient Details  Name: Courtney Parsons MRN: [id number] Date of Birth: 14-Feb-1976 Referring Provider: Gean Birchwood MD.  Encounter Date: 12/18/2016      PT End of Session - 12/18/16 1650    Visit Number 5   Number of Visits 12   Date for PT Re-Evaluation 01/12/17   PT Start Time 1650  1 unit per limitation with pain and use of moist heat only   PT Stop Time 1725   PT Time Calculation (min) 35 min   Activity Tolerance Patient limited by pain   Behavior During Therapy Restless  Crying      Past Medical History:  Diagnosis Date  . ADD (attention deficit disorder)   . Ovarian cyst    h/o    Past Surgical History:  Procedure Laterality Date  . ABDOMINAL HYSTERECTOMY    . ANTERIOR CERVICAL DECOMP/DISCECTOMY FUSION N/A 04/28/2014   Procedure: ANTERIOR CERVICAL DECOMPRESSION/DISCECTOMY FUSION 1 LEVEL Cervical six/seven;  Surgeon: Carmela Hurt, MD;  Location: [facility];  Service: Neurosurgery;  Laterality: N/A;  . CESAREAN SECTION    . LAPAROSCOPIC OVARIAN CYSTECTOMY Right 01/16/2015   Procedure: LAPAROSCOPIC OVARIAN CYSTECTOMY / EXTENSIVE ABDOMINAL LYSIS OF ADHESIONS;  Surgeon: Ok Edwards, MD;  Location: [facility];  Service: Gynecology;  Laterality: Right;  . LAPAROSCOPIC UNILATERAL SALPINGECTOMY Right 01/16/2015   Procedure: LAPAROSCOPIC UNILATERAL SALPINGECTOMY;  Surgeon: Ok Edwards, MD;  Location: [facility];  Service: Gynecology;  Laterality: Right;  . PELVIC LAPAROSCOPY      There were no vitals filed for this visit.      Subjective Assessment - 12/18/16 1649    Subjective Reports that she has had another bad day and currently sick to her stomach from the pain. Reports she had no relief from previous treatment. Patient expressed great frustration with not being able get in touch with adjustor and  with being in so much pain.   Pertinent History Cervical fusion C6-7   Diagnostic tests MRI.   Patient Stated Goals Get out of pain.  I want to jog again.   Currently in Pain? Yes   Pain Score 8    Pain Location Neck   Pain Orientation Left   Pain Descriptors / Indicators Discomfort;Aching   Pain Type Acute pain   Pain Onset More than a month ago   Pain Frequency Constant   Pain Score 8   Pain Location Shoulder   Pain Orientation Left   Pain Descriptors / Indicators Discomfort            [facility] PT Assessment - 12/18/16 0001      Assessment   Medical Diagnosis Left shouler impingement.   Onset Date/Surgical Date 10/07/16   Next MD Visit 01/01/2017     Precautions   Precautions None     Restrictions   Weight Bearing Restrictions No                     [facility] Adult PT Treatment/Exercise - 12/18/16 0001      Modalities   Modalities Moist Heat;Ultrasound     Moist Heat Therapy   Number Minutes Moist Heat 15 Minutes   Moist Heat Location Cervical;Shoulder     Ultrasound   Ultrasound Location L UT   Ultrasound Parameters 1.5 w/cm2, 100%,1 mhz x10 min   Ultrasound Goals Pain  Manual Therapy   Manual Therapy Soft tissue mobilization   Soft tissue mobilization Attempted light STW to L UT and L deltoids/ Bicep but too painful with light pressure                     PT Long Term Goals - 12/01/16 1252      PT LONG TERM GOAL #1   Title Independent with a HEP.   Time 6   Period Weeks   Status New     PT LONG TERM GOAL #2   Title Active left shoulder flexion to 155 degrees so the patient can easily reach overhead   Time 6   Period Weeks   Status New     PT LONG TERM GOAL #3   Title Active ER to 80 degrees+ to allow for easily donning/doffing of apparel   Time 6   Period Weeks   Status New     PT LONG TERM GOAL #4   Title Increase ROM so patient is able to reach behind back to L2 with left hand.   Time 6   Period Weeks   Status  New     PT LONG TERM GOAL #5   Title Increase left shoulder strength to a solid 4+/5 to increase stability for performance of functional activities.   Time 6   Period Weeks   Status New     Additional Long Term Goals   Additional Long Term Goals Yes     PT LONG TERM GOAL #6   Title Perform ADL's with pain not > 3/10.   Time 6   Period Weeks   Status New               Plan - 12/18/16 1729    Clinical Impression Statement Patient extremely limited with treatment today secondary to intense pain. Patient experiences headaches almost constantly, dizziness and is extremely frustrated with lack of communication with no relief or progress in PT at this time per patient report. Increased tone continues to be palpable in L UT and L deltoids with continued increased tenderness reported by patient. [place] completed with hopes of reducing tone and pain with normal response. Light pressure manual therapy attempted in both L UT and L deltoids/Bicep region but manual therapy too painful for patient thus it was stopped. Moist heat applied to cervical musculature and L shoulder with normal response following removal of the modality. Patient was encouraged to contact referring MD to update him regarding the pain and the lack of progress with PT and to then contact PT clinic with update.    Rehab Potential Fair   PT Frequency 2x / week   PT Duration 6 weeks   PT Treatment/Interventions ADLs/Self Care Home Management;Moist Heat;Electrical Stimulation;Ultrasound;Patient/family education;Therapeutic exercise;Therapeutic activities;Manual techniques;Passive range of motion;Vasopneumatic Device   PT Next Visit Plan Continue per MD discretion.   Consulted and Agree with Plan of Care Patient      Patient will benefit from skilled therapeutic intervention in order to improve the following deficits and impairments:  Pain, Decreased activity tolerance, Decreased range of motion  Visit Diagnosis: Acute pain of  left shoulder  Stiffness of left shoulder, not elsewhere classified     Problem List Patient Active Problem List   Diagnosis Date Noted  . HNP (herniated nucleus pulposus), cervical 04/28/2014    Evelene Croon, PTA 12/18/2016, 5:37 PM  [facility] [address] [place], [place], [address] Phone: [phone]  Fax:  [phone]  Name: Henderson Cloudammy L Sassi MRN: [id number] Date of Birth: 11/10/1975

## 2016-12-22 MED FILL — DICLOFENAC SODIUM 1% GEL: 1 | 5 days supply | Qty: 100 | Fill #0

## 2016-12-23 ENCOUNTER — Encounter: Payer: Self-pay | Admitting: Physical Therapy

## 2016-12-23 ENCOUNTER — Ambulatory Visit: Payer: PRIVATE HEALTH INSURANCE | Admitting: Physical Therapy

## 2016-12-23 DIAGNOSIS — M25612 Stiffness of left shoulder, not elsewhere classified: Secondary | ICD-10-CM

## 2016-12-23 DIAGNOSIS — M25512 Pain in left shoulder: Secondary | ICD-10-CM

## 2016-12-23 NOTE — Therapy (Signed)
[facility] [address] [place], [place], [address] Phone: [phone]   Fax:  [phone]  Physical Therapy Treatment  Patient Details  Name: Courtney Parsons MRN: [id number] Date of Birth: 1976/07/02 Referring Provider: Gean Birchwood MD.  Encounter Date: 12/23/2016      PT End of Session - 12/23/16 1650    Visit Number 6   Number of Visits 12   Date for PT Re-Evaluation 01/12/17   PT Start Time 1648   PT Stop Time 1735   PT Time Calculation (min) 47 min   Activity Tolerance Patient limited by pain   Behavior During Therapy Restless      Past Medical History:  Diagnosis Date  . ADD (attention deficit disorder)   . Ovarian cyst    h/o    Past Surgical History:  Procedure Laterality Date  . ABDOMINAL HYSTERECTOMY    . ANTERIOR CERVICAL DECOMP/DISCECTOMY FUSION N/A 04/28/2014   Procedure: ANTERIOR CERVICAL DECOMPRESSION/DISCECTOMY FUSION 1 LEVEL Cervical six/seven;  Surgeon: Carmela Hurt, MD;  Location: [facility];  Service: Neurosurgery;  Laterality: N/A;  . CESAREAN SECTION    . LAPAROSCOPIC OVARIAN CYSTECTOMY Right 01/16/2015   Procedure: LAPAROSCOPIC OVARIAN CYSTECTOMY / EXTENSIVE ABDOMINAL LYSIS OF ADHESIONS;  Surgeon: Ok Edwards, MD;  Location: [facility];  Service: Gynecology;  Laterality: Right;  . LAPAROSCOPIC UNILATERAL SALPINGECTOMY Right 01/16/2015   Procedure: LAPAROSCOPIC UNILATERAL SALPINGECTOMY;  Surgeon: Ok Edwards, MD;  Location: [facility];  Service: Gynecology;  Laterality: Right;  . PELVIC LAPAROSCOPY      There were no vitals filed for this visit.      Subjective Assessment - 12/23/16 1649    Subjective Reports that she was made to complete CPR training at work today and was given ibuporfen by CPR instructor. Reports that she moves her shoulder throughout the day. Patient reports that she did get a response from the adjustor and all it said was "will do" but did not report if she called Dr. Turner Daniels.    Pertinent History Cervical fusion C6-7   Diagnostic tests MRI.   Patient Stated Goals Get out of pain.  I want to jog again.   Currently in Pain? Yes   Pain Score 9    Pain Location Neck   Pain Orientation Left   Pain Descriptors / Indicators Throbbing;Pounding;Dull   Pain Type Acute pain   Pain Onset More than a month ago   Pain Frequency Constant   Pain Score 8   Pain Location Shoulder   Pain Orientation Left   Pain Descriptors / Indicators Shooting;Aching   Pain Type Acute pain   Pain Frequency Constant            [facility] PT Assessment - 12/23/16 0001      Assessment   Medical Diagnosis Left shouler impingement.   Onset Date/Surgical Date 10/07/16   Next MD Visit 01/01/2017     Precautions   Precautions None     Restrictions   Weight Bearing Restrictions No                     [facility] Adult PT Treatment/Exercise - 12/23/16 0001      Exercises   Exercises Shoulder     Shoulder Exercises: Seated   Flexion AROM;Left;20 reps  Table slides   Flexion Limitations catching at full table slide   Other Seated Exercises Seated LUE ranger flex x5 min  Experienced pulling in lateral side of neck  Shoulder Exercises: ROM/Strengthening   UBE (Upper Arm Bike) Attempted but unable secondary to pain  VCs for LUE relaxation, RUE doing the work     Modalities   Modalities Moist Heat     Moist Heat Therapy   Number Minutes Moist Heat 15 Minutes   Moist Heat Location Cervical;Shoulder     Manual Therapy   Manual Therapy Soft tissue mobilization   Soft tissue mobilization Gentle STW to L scalenes, UT, Deltoids to reduce tone and pain                PT Education - 12/23/16 1730    Education provided Yes   Education Details HEP- tables slides into flexion, AAROM ER   Person(s) Educated Patient   Methods Explanation;Demonstration;Verbal cues;Handout   Comprehension Verbalized understanding;Returned demonstration;Verbal cues required              PT Long Term Goals - 12/01/16 1252      PT LONG TERM GOAL #1   Title Independent with a HEP.   Time 6   Period Weeks   Status New     PT LONG TERM GOAL #2   Title Active left shoulder flexion to 155 degrees so the patient can easily reach overhead   Time 6   Period Weeks   Status New     PT LONG TERM GOAL #3   Title Active ER to 80 degrees+ to allow for easily donning/doffing of apparel   Time 6   Period Weeks   Status New     PT LONG TERM GOAL #4   Title Increase ROM so patient is able to reach behind back to L2 with left hand.   Time 6   Period Weeks   Status New     PT LONG TERM GOAL #5   Title Increase left shoulder strength to a solid 4+/5 to increase stability for performance of functional activities.   Time 6   Period Weeks   Status New     Additional Long Term Goals   Additional Long Term Goals Yes     PT LONG TERM GOAL #6   Title Perform ADL's with pain not > 3/10.   Time 6   Period Weeks   Status New               Plan - 12/23/16 1733    Clinical Impression Statement Patient presented in clinic with continued high level cervical and shoulder pain today. Patient was guided through various gentle AAROM exercises. Patient unable to complete UBE secondary to increased pain as LUE ascended into flexion. Patient reported experiencing catching sensation at end avaliable range flexion for table slide. Patient experienced pull up L lateral neck in scalene region during seated LUE ranger into flexion. Gentle STW completed to L posterior scalene, UT, and deltoids to reduce tone and pain although facial grimacing noted throughout manual therapy. Moist heat applied to cervical and shoulder musculature with normal response. HEP provided with gentle AAROM exercises with education regarding technique and parameters with education that if pain persists then to stop HEP.   Rehab Potential Fair   PT Frequency 2x / week   PT Duration 6 weeks   PT  Treatment/Interventions ADLs/Self Care Home Management;Moist Heat;Electrical Stimulation;Ultrasound;Patient/family education;Therapeutic exercise;Therapeutic activities;Manual techniques;Passive range of motion;Vasopneumatic Device   PT Next Visit Plan Continue with gentle shoulder exercises and moist heat per MPT POC.   PT Home Exercise Plan HEP- table slides into flexion, AAROM ER  Consulted and Agree with Plan of Care Patient      Patient will benefit from skilled therapeutic intervention in order to improve the following deficits and impairments:  Pain, Decreased activity tolerance, Decreased range of motion  Visit Diagnosis: Acute pain of left shoulder  Stiffness of left shoulder, not elsewhere classified     Problem List Patient Active Problem List   Diagnosis Date Noted  . HNP (herniated nucleus pulposus), cervical 04/28/2014    Evelene CroonKelsey M Parsons, PTA 12/23/2016, 5:54 PM  [facility] [address] [place], [place], [address] Phone: [phone]   Fax:  [phone]  Name: Henderson Cloudammy L Balentine MRN: [id number] Date of Birth: 01-06-1976

## 2016-12-23 NOTE — Patient Instructions (Addendum)
ROM: Flexion    Keeping left arm on table, slide body away until stretch is felt. Or place hand on towel on dinner table and push towel forward gently. Hold __4__ seconds. Repeat __10-15__ times per set. Do ____ sets per session. Do __4-5__ sessions per day.  http://orth.exer.us/756   Copyright  VHI. All rights reserved.  ROM: External / Internal Rotation - Wand    Holding wand with left hand palm up, push out from body with other hand, palm down. Keep both elbows bent. When stretch is felt, hold __2-3__ seconds. Repeat to other side, leading with same hand. Keep elbows bent. Repeat __10__ times per set. Do _2___ sets per session. Do __2-3__ sessions per day.  http://orth.exer.us/748   Copyright  VHI. All rights reserved.

## 2016-12-25 ENCOUNTER — Encounter: Payer: Self-pay | Admitting: Family Medicine

## 2016-12-25 ENCOUNTER — Encounter: Payer: Self-pay | Admitting: Physical Therapy

## 2016-12-25 ENCOUNTER — Ambulatory Visit (INDEPENDENT_AMBULATORY_CARE_PROVIDER_SITE_OTHER): Payer: 59 | Admitting: Family Medicine

## 2016-12-25 ENCOUNTER — Ambulatory Visit: Payer: PRIVATE HEALTH INSURANCE | Admitting: Physical Therapy

## 2016-12-25 VITALS — BP 134/82 | HR 85 | Temp 97.6°F | Ht 60.0 in | Wt 142.0 lb

## 2016-12-25 DIAGNOSIS — M542 Cervicalgia: Secondary | ICD-10-CM

## 2016-12-25 DIAGNOSIS — M25512 Pain in left shoulder: Secondary | ICD-10-CM | POA: Diagnosis not present

## 2016-12-25 DIAGNOSIS — M25612 Stiffness of left shoulder, not elsewhere classified: Secondary | ICD-10-CM

## 2016-12-25 MED ORDER — NAPROXEN SODIUM 550 MG PO TABS: 550.0000 mg | ORAL_TABLET | Freq: Two times a day (BID) | ORAL | 0 refills | Status: DC

## 2016-12-25 MED ORDER — METHOCARBAMOL 750 MG PO TABS: 750.0000 mg | ORAL_TABLET | Freq: Four times a day (QID) | ORAL | 0 refills | Status: DC | PRN

## 2016-12-25 MED FILL — ADDERALL XR 20 MG CAP SA: 20 | 30 days supply | Qty: 30 | Fill #0

## 2016-12-25 MED FILL — AMPHETAMINE SALTS 20 MG TAB: 20 | 30 days supply | Qty: 30 | Fill #0

## 2016-12-25 NOTE — Therapy (Signed)
[facility] [address] [place], [place], [address] Phone: [phone]   Fax:  [phone]  Physical Therapy Treatment  Patient Details  Name: Courtney Parsons MRN: [id number] Date of Birth: 07-29-1976 Referring Provider: Gean BirchwoodFrank Rowan MD.  Encounter Date: 12/25/2016      PT End of Session - 12/25/16 1706    Visit Number 7   Number of Visits 12   Date for PT Re-Evaluation 01/12/17   PT Start Time 1651  1 unit secondary to inability to tolerate treatment and use of moist heat   PT Stop Time 1720   PT Time Calculation (min) 29 min   Activity Tolerance Patient limited by pain   Behavior During Therapy Restless      Past Medical History:  Diagnosis Date  . ADD (attention deficit disorder)   . Ovarian cyst    h/o    Past Surgical History:  Procedure Laterality Date  . ABDOMINAL HYSTERECTOMY    . ANTERIOR CERVICAL DECOMP/DISCECTOMY FUSION N/A 04/28/2014   Procedure: ANTERIOR CERVICAL DECOMPRESSION/DISCECTOMY FUSION 1 LEVEL Cervical six/seven;  Surgeon: Carmela HurtKyle L Cabbell, MD;  Location: [facility];  Service: Neurosurgery;  Laterality: N/A;  . CESAREAN SECTION    . LAPAROSCOPIC OVARIAN CYSTECTOMY Right 01/16/2015   Procedure: LAPAROSCOPIC OVARIAN CYSTECTOMY / EXTENSIVE ABDOMINAL LYSIS OF ADHESIONS;  Surgeon: Ok EdwardsJuan H Fernandez, MD;  Location: [facility];  Service: Gynecology;  Laterality: Right;  . LAPAROSCOPIC UNILATERAL SALPINGECTOMY Right 01/16/2015   Procedure: LAPAROSCOPIC UNILATERAL SALPINGECTOMY;  Surgeon: Ok EdwardsJuan H Fernandez, MD;  Location: [facility];  Service: Gynecology;  Laterality: Right;  . PELVIC LAPAROSCOPY      There were no vitals filed for this visit.      Subjective Assessment - 12/25/16 1652    Subjective Reports that she called a lawyer and sees him 01/01/2017. Reports that she called the neurosurgeon who did her neck surgery to get an appointment with him. States that she is to the point of going to the ER due to pain. Has  had headache and nausea all day today. Reports that she will be home by herself this weekend as family is gone out of town.   Pertinent History Cervical fusion C6-7   Diagnostic tests MRI.   Patient Stated Goals Get out of pain.  I want to jog again.   Currently in Pain? Yes   Pain Score 10-Worst pain ever   Pain Location Neck   Pain Orientation Right;Left   Pain Descriptors / Indicators Headache;Pressure;Spasm   Pain Type Acute pain   Pain Onset More than a month ago   Pain Frequency Constant            [facility] PT Assessment - 12/25/16 0001      Assessment   Medical Diagnosis Left shouler impingement.   Onset Date/Surgical Date 10/07/16   Next MD Visit 01/01/2017     Precautions   Precautions None     Restrictions   Weight Bearing Restrictions No                     [facility] Adult PT Treatment/Exercise - 12/25/16 0001      Modalities   Modalities Moist Heat     Moist Heat Therapy   Number Minutes Moist Heat 15 Minutes   Moist Heat Location Cervical;Shoulder     Manual Therapy   Manual Therapy Soft tissue mobilization   Soft tissue mobilization Very gentle STW to L UT, Deltoids, Bicep region to reduce tone  and pain                     PT Long Term Goals - 12/01/16 1252      PT LONG TERM GOAL #1   Title Independent with a HEP.   Time 6   Period Weeks   Status New     PT LONG TERM GOAL #2   Title Active left shoulder flexion to 155 degrees so the patient can easily reach overhead   Time 6   Period Weeks   Status New     PT LONG TERM GOAL #3   Title Active ER to 80 degrees+ to allow for easily donning/doffing of apparel   Time 6   Period Weeks   Status New     PT LONG TERM GOAL #4   Title Increase ROM so patient is able to reach behind back to L2 with left hand.   Time 6   Period Weeks   Status New     PT LONG TERM GOAL #5   Title Increase left shoulder strength to a solid 4+/5 to increase stability for performance of  functional activities.   Time 6   Period Weeks   Status New     Additional Long Term Goals   Additional Long Term Goals Yes     PT LONG TERM GOAL #6   Title Perform ADL's with pain not > 3/10.   Time 6   Period Weeks   Status New               Plan - 12/25/16 1732    Clinical Impression Statement Patient presented in clinic today in tears secondary to intense pain that she has experienced throughout the day. Sleep has been limited secondary to patient and patient is now experiencing pain along the R side of her neck. Increased tone palpated along L UT, Deltoids and Bicep region and now along R cervical paraspinals and scalenes. Patient unable to tolerate period of manual therapy to L UT region. Moist heat applied to reduce pain as patient unable to tolerate other modalities. No change in pain with moist heat per patient report. No reduction in pain or tone noted with manual therapy to L deltoids and bicep musculature. Patient very discouraged by pain and condition at this time.   Rehab Potential Fair   PT Frequency 2x / week   PT Duration 6 weeks   PT Treatment/Interventions ADLs/Self Care Home Management;Moist Heat;Electrical Stimulation;Ultrasound;Patient/family education;Therapeutic exercise;Therapeutic activities;Manual techniques;Passive range of motion;Vasopneumatic Device   PT Next Visit Plan Continue as symptoms dictate per MPT POC.   PT Home Exercise Plan HEP- table slides into flexion, AAROM ER   Consulted and Agree with Plan of Care Patient      Patient will benefit from skilled therapeutic intervention in order to improve the following deficits and impairments:  Pain, Decreased activity tolerance, Decreased range of motion  Visit Diagnosis: Acute pain of left shoulder  Stiffness of left shoulder, not elsewhere classified     Problem List Patient Active Problem List   Diagnosis Date Noted  . HNP (herniated nucleus pulposus), cervical 04/28/2014    Evelene Croon, PTA 12/25/2016, 5:39 PM  [facility] [address] [place], [place], [address] Phone: [phone]   Fax:  [phone]  Name: Courtney Parsons MRN: [id number] Date of Birth: 1976-01-21

## 2016-12-25 NOTE — Progress Notes (Signed)
   HPI  Patient presents today here with severe neck pain.  She is new to our practice.  Patient explains that she had a fall in January, she was seen by orthopedics for this and treated for shoulder pain, she states that her neck has been hurting her since the fall. Over the last 2 days it's beginning to get more severe.  Patient has a history of herniated disc with surgical fixation.   She's tried 800 mg of ibuprofen with no improvement. Patient has tried Flexeril. She states that she's had a dramatic swelling reaction to steroids.  She states she was given tramadol to try for her shoulder pain which did not help the pain.  She's been doing physical therapy for shoulder pain. She was seen in physical therapy earlier today and sent from their clinic to ours.  She does endorse tingling of the fingers  She has tried Voltaren gel as well as no improvement  PMH: ADD, ovarian cysts Surgical history of abdominal hysterectomy, ACDF of the cervical spine, C-section, ovarian cystectomy, salpingectomy Social history: No alcohol use, nonsmoker ROS: Per HPI  Objective: BP 134/82   Pulse 85   Temp 97.6 F (36.4 C) (Oral)   Ht 5' (1.524 m)   Wt 142 lb (64.4 kg)   BMI 27.73 kg/m  Gen: NAD, alert, patient is in tears several times throughout the exam HEENT: NCAT CV: RRR, good S1/S2, no murmur Resp: CTABL, no wheezes, non-labored Ext: No edema, warm Neuro: Alert and oriented, strength 4/5 and symmetric in bilateral grip and extension of the upper extremity,  MSK:  Range of motion of the neck, tenderness to palpation of the paraspinal muscles bilaterally, midline tenderness to palpation as well   Assessment and plan:  # Neck pain Very severe neck pain, patient has limited range of motion, complains of tingling of the fingers intermittently bilaterally. When testing her strength of the upper extremity she has acute worsening of the pain.  Have advised her to go to the emergency room  for evaluation. With history of ACDF of the cervical spine I think x-ray is warranted, this is not available after hours in our clinic. Have prescribed maximum dose NSAIDs plus muscle relaxers.  I believe corticosteroids would be helpful but she has had an adverse reaction to these.     Meds ordered this encounter  Medications  . diclofenac sodium (VOLTAREN) 1 % GEL    Refill:  3  . naproxen sodium (ANAPROX DS) 550 MG tablet    Sig: Take 1 tablet (550 mg total) by mouth 2 (two) times daily with a meal.    Dispense:  30 tablet    Refill:  0  . methocarbamol (ROBAXIN-750) 750 MG tablet    Sig: Take 1 tablet (750 mg total) by mouth 4 (four) times daily as needed for muscle spasms.    Dispense:  40 tablet    Refill:  0    Murtis SinkSam Annalisia Ingber, MD [facility] 12/25/2016, 6:20 PM

## 2016-12-25 NOTE — Patient Instructions (Signed)
It was great to meet you!  I think ER evaluation would be appropriate for you.   If you choose not to go you can try the anaprox and robaxin to see if you can get some relief from these.

## 2016-12-30 ENCOUNTER — Encounter: Payer: Self-pay | Admitting: Physical Therapy

## 2016-12-30 ENCOUNTER — Ambulatory Visit: Payer: PRIVATE HEALTH INSURANCE | Attending: Orthopedic Surgery | Admitting: Physical Therapy

## 2016-12-30 DIAGNOSIS — M25612 Stiffness of left shoulder, not elsewhere classified: Secondary | ICD-10-CM | POA: Insufficient documentation

## 2016-12-30 DIAGNOSIS — M25512 Pain in left shoulder: Secondary | ICD-10-CM | POA: Insufficient documentation

## 2016-12-30 NOTE — Therapy (Addendum)
[facility] [place], [place], [address] Phone: [phone]   Fax:  [phone]  Physical Therapy Treatment  Patient Details  Name: Courtney Parsons MRN: [id number] Date of Birth: 17-Jun-1976 Referring Provider: Frederik Pear MD.  Encounter Date: 12/30/2016      PT End of Session - 12/30/16 1646    Visit Number 8   Number of Visits 12   Date for PT Re-Evaluation 01/12/17   PT Start Time 0086  2 units secondary to use of moist heat   PT Stop Time 1730   PT Time Calculation (min) 44 min   Activity Tolerance Patient limited by pain   Behavior During Therapy Restless      Past Medical History:  Diagnosis Date  . ADD (attention deficit disorder)   . Ovarian cyst    h/o    Past Surgical History:  Procedure Laterality Date  . ABDOMINAL HYSTERECTOMY    . ANTERIOR CERVICAL DECOMP/DISCECTOMY FUSION N/A 04/28/2014   Procedure: ANTERIOR CERVICAL DECOMPRESSION/DISCECTOMY FUSION 1 LEVEL Cervical six/seven;  Surgeon: Winfield Cunas, MD;  Location: [facility];  Service: Neurosurgery;  Laterality: N/A;  . CESAREAN SECTION    . LAPAROSCOPIC OVARIAN CYSTECTOMY Right 01/16/2015   Procedure: LAPAROSCOPIC OVARIAN CYSTECTOMY / EXTENSIVE ABDOMINAL LYSIS OF ADHESIONS;  Surgeon: Terrance Mass, MD;  Location: [facility];  Service: Gynecology;  Laterality: Right;  . LAPAROSCOPIC UNILATERAL SALPINGECTOMY Right 01/16/2015   Procedure: LAPAROSCOPIC UNILATERAL SALPINGECTOMY;  Surgeon: Terrance Mass, MD;  Location: [facility];  Service: Gynecology;  Laterality: Right;  . PELVIC LAPAROSCOPY      There were no vitals filed for this visit.      Subjective Assessment - 12/30/16 1644    Subjective Now using neck pillow in her head rest of her car. Reports that she feels like she is going to choke if anything touches the back of her neck and reports recent balance deficits. Patient also reports that she drinks water throughout the day and has to make  herself go to the bathroom as she does not sense she has to use bathroom lately. Also reports that pain runs down along her cervical spine to between her shoulder blades.   Pertinent History Cervical fusion C6-7   Diagnostic tests MRI.   Patient Stated Goals Get out of pain.  I want to jog again.   Currently in Pain? Yes   Pain Score 10-Worst pain ever   Pain Location Neck   Pain Orientation Right;Left   Pain Descriptors / Indicators Pressure;Tightness;Discomfort;Headache   Pain Type Acute pain   Pain Onset More than a month ago   Pain Frequency Constant   Pain Score 8   Pain Location Shoulder   Pain Orientation Left   Pain Descriptors / Indicators Discomfort   Pain Type Acute pain            [facility] PT Assessment - 12/30/16 0001      Assessment   Medical Diagnosis Left shouler impingement.   Onset Date/Surgical Date 10/07/16   Next MD Visit 01/01/2017     Precautions   Precautions None     Restrictions   Weight Bearing Restrictions No                     [facility] Adult PT Treatment/Exercise - 12/30/16 0001      Modalities   Modalities Moist Heat     Moist Heat Therapy   Number Minutes Moist Heat 15 Minutes  Moist Heat Location Cervical;Shoulder     Manual Therapy   Manual Therapy Soft tissue mobilization   Soft tissue mobilization Very gentle STW to B UT, Levator Scapula, cervical paraspinals, deltoids, Bicep region                     PT Long Term Goals - 12/01/16 1252      PT LONG TERM GOAL #1   Title Independent with a HEP.   Time 6   Period Weeks   Status New     PT LONG TERM GOAL #2   Title Active left shoulder flexion to 155 degrees so the patient can easily reach overhead   Time 6   Period Weeks   Status New     PT LONG TERM GOAL #3   Title Active ER to 80 degrees+ to allow for easily donning/doffing of apparel   Time 6   Period Weeks   Status New     PT LONG TERM GOAL #4   Title Increase ROM so patient is able to  reach behind back to L2 with left hand.   Time 6   Period Weeks   Status New     PT LONG TERM GOAL #5   Title Increase left shoulder strength to a solid 4+/5 to increase stability for performance of functional activities.   Time 6   Period Weeks   Status New     Additional Long Term Goals   Additional Long Term Goals Yes     PT LONG TERM GOAL #6   Title Perform ADL's with pain not > 3/10.   Time 6   Period Weeks   Status New               Plan - 12/30/16 1723    Clinical Impression Statement Patient presented in clinic with increased cervical pain with decreased cervical ROM upon entrance into clinic. Patient's R cervical/clavicular region observed as inflammed and swollen when compared to L side. Patient very sensitive to very gentle STW to B UT, Levator Scapula, cervical paraspinals and into scapular retractors. Patient very sensitive to palpation along L deltoids region especially along posterior deltoids. Normal moist heat application noted upon removal of the modalities. Goals remain on-going secondary to limitation by pain.   Rehab Potential Fair   PT Frequency 2x / week   PT Duration 6 weeks   PT Treatment/Interventions ADLs/Self Care Home Management;Moist Heat;Electrical Stimulation;Ultrasound;Patient/family education;Therapeutic exercise;Therapeutic activities;Manual techniques;Passive range of motion;Vasopneumatic Device   PT Next Visit Plan Continue as symptoms dictate per MPT POC.   PT Home Exercise Plan HEP- table slides into flexion, AAROM ER   Consulted and Agree with Plan of Care Patient      Patient will benefit from skilled therapeutic intervention in order to improve the following deficits and impairments:  Pain, Decreased activity tolerance, Decreased range of motion  Visit Diagnosis: Acute pain of left shoulder  Stiffness of left shoulder, not elsewhere classified     Problem List Patient Active Problem List   Diagnosis Date Noted  . HNP  (herniated nucleus pulposus), cervical 04/28/2014    Wynelle Fanny, PTA 12/30/2016, 5:40 PM  [facility] [place], [place], [address] Phone: [phone]   Fax:  [phone]  Name: Courtney Parsons MRN: [id number] Date of Birth: June 29, 1976  PHYSICAL THERAPY DISCHARGE SUMMARY  Visits from Start of Care: 8.  Current functional level related to goals / functional outcomes: See  above.   Remaining deficits: No goals met.   Education / Equipment: HEP. Plan: Patient agrees to discharge.  Patient goals were not met. Patient is being discharged due to not returning since the last visit.  ?????         Mali Applegate MPT

## 2017-01-01 DIAGNOSIS — R202 Paresthesia of skin: Secondary | ICD-10-CM | POA: Diagnosis not present

## 2017-01-01 DIAGNOSIS — M542 Cervicalgia: Secondary | ICD-10-CM | POA: Diagnosis not present

## 2017-01-01 DIAGNOSIS — M4312 Spondylolisthesis, cervical region: Secondary | ICD-10-CM | POA: Diagnosis not present

## 2017-01-01 DIAGNOSIS — Z885 Allergy status to narcotic agent status: Secondary | ICD-10-CM | POA: Diagnosis not present

## 2017-01-01 DIAGNOSIS — M62838 Other muscle spasm: Secondary | ICD-10-CM | POA: Diagnosis not present

## 2017-01-01 DIAGNOSIS — Z881 Allergy status to other antibiotic agents status: Secondary | ICD-10-CM | POA: Diagnosis not present

## 2017-01-01 DIAGNOSIS — F988 Other specified behavioral and emotional disorders with onset usually occurring in childhood and adolescence: Secondary | ICD-10-CM | POA: Diagnosis not present

## 2017-01-01 DIAGNOSIS — M47812 Spondylosis without myelopathy or radiculopathy, cervical region: Secondary | ICD-10-CM | POA: Diagnosis not present

## 2017-01-01 DIAGNOSIS — Z79899 Other long term (current) drug therapy: Secondary | ICD-10-CM | POA: Diagnosis not present

## 2017-01-01 DIAGNOSIS — M5022 Other cervical disc displacement, mid-cervical region, unspecified level: Secondary | ICD-10-CM | POA: Diagnosis not present

## 2017-01-01 DIAGNOSIS — Z981 Arthrodesis status: Secondary | ICD-10-CM | POA: Diagnosis not present

## 2017-01-01 DIAGNOSIS — Z880 Allergy status to penicillin: Secondary | ICD-10-CM | POA: Diagnosis not present

## 2017-01-02 ENCOUNTER — Encounter: Payer: Self-pay | Admitting: Family Medicine

## 2017-01-06 ENCOUNTER — Encounter: Payer: Self-pay | Admitting: Physical Therapy

## 2017-01-06 DIAGNOSIS — M5020 Other cervical disc displacement, unspecified cervical region: Secondary | ICD-10-CM | POA: Diagnosis not present

## 2017-01-06 DIAGNOSIS — Z6828 Body mass index (BMI) 28.0-28.9, adult: Secondary | ICD-10-CM | POA: Diagnosis not present

## 2017-01-06 DIAGNOSIS — R030 Elevated blood-pressure reading, without diagnosis of hypertension: Secondary | ICD-10-CM | POA: Diagnosis not present

## 2017-01-08 ENCOUNTER — Encounter: Payer: Self-pay | Admitting: Physical Therapy

## 2017-01-13 ENCOUNTER — Ambulatory Visit: Payer: PRIVATE HEALTH INSURANCE | Admitting: Physical Therapy

## 2017-01-15 ENCOUNTER — Encounter: Payer: Self-pay | Admitting: Physical Therapy

## 2017-01-20 DIAGNOSIS — M47892 Other spondylosis, cervical region: Secondary | ICD-10-CM | POA: Diagnosis not present

## 2017-02-02 DIAGNOSIS — R200 Anesthesia of skin: Secondary | ICD-10-CM | POA: Diagnosis not present

## 2017-02-02 DIAGNOSIS — M4712 Other spondylosis with myelopathy, cervical region: Secondary | ICD-10-CM | POA: Diagnosis not present

## 2017-02-02 DIAGNOSIS — M47892 Other spondylosis, cervical region: Secondary | ICD-10-CM | POA: Diagnosis not present

## 2017-02-02 DIAGNOSIS — M791 Myalgia: Secondary | ICD-10-CM | POA: Diagnosis not present

## 2017-02-02 DIAGNOSIS — M9981 Other biomechanical lesions of cervical region: Secondary | ICD-10-CM | POA: Diagnosis not present

## 2017-02-02 DIAGNOSIS — M47812 Spondylosis without myelopathy or radiculopathy, cervical region: Secondary | ICD-10-CM | POA: Diagnosis not present

## 2017-02-02 MED FILL — ADDERALL XR 20 MG CAP SA: 20 | 30 days supply | Qty: 30 | Fill #0

## 2017-02-02 MED FILL — DEXTROAMP-AMPHETAMIN 20 MG: 20 | 30 days supply | Qty: 30 | Fill #0

## 2017-02-02 MED FILL — METAXALONE 800 MG TABLET: 800 | 30 days supply | Qty: 90 | Fill #0

## 2017-02-02 MED FILL — GABAPENTIN 100 MG CAPSULE: 100 | 30 days supply | Qty: 90 | Fill #0

## 2017-02-08 DIAGNOSIS — S0003XA Contusion of scalp, initial encounter: Secondary | ICD-10-CM | POA: Diagnosis not present

## 2017-02-08 DIAGNOSIS — S00511A Abrasion of lip, initial encounter: Secondary | ICD-10-CM | POA: Diagnosis not present

## 2017-02-08 DIAGNOSIS — Z79899 Other long term (current) drug therapy: Secondary | ICD-10-CM | POA: Diagnosis not present

## 2017-02-08 DIAGNOSIS — M25511 Pain in right shoulder: Secondary | ICD-10-CM | POA: Diagnosis not present

## 2017-02-08 DIAGNOSIS — S4991XA Unspecified injury of right shoulder and upper arm, initial encounter: Secondary | ICD-10-CM | POA: Diagnosis not present

## 2017-02-08 DIAGNOSIS — S8002XA Contusion of left knee, initial encounter: Secondary | ICD-10-CM | POA: Diagnosis not present

## 2017-02-08 DIAGNOSIS — S40011A Contusion of right shoulder, initial encounter: Secondary | ICD-10-CM | POA: Diagnosis not present

## 2017-02-08 DIAGNOSIS — S0990XA Unspecified injury of head, initial encounter: Secondary | ICD-10-CM | POA: Diagnosis not present

## 2017-02-11 ENCOUNTER — Encounter: Payer: Self-pay | Admitting: Gynecology

## 2017-02-18 ENCOUNTER — Encounter: Payer: Self-pay | Admitting: Family Medicine

## 2017-02-18 ENCOUNTER — Ambulatory Visit (INDEPENDENT_AMBULATORY_CARE_PROVIDER_SITE_OTHER): Payer: 59 | Admitting: Family Medicine

## 2017-02-18 VITALS — BP 119/69 | HR 74 | Temp 98.7°F | Ht 60.0 in | Wt 143.0 lb

## 2017-02-18 DIAGNOSIS — R399 Unspecified symptoms and signs involving the genitourinary system: Secondary | ICD-10-CM | POA: Diagnosis not present

## 2017-02-18 DIAGNOSIS — N3090 Cystitis, unspecified without hematuria: Secondary | ICD-10-CM | POA: Diagnosis not present

## 2017-02-18 LAB — URINALYSIS, COMPLETE
BILIRUBIN UA: NEGATIVE
LEUKOCYTES UA: NEGATIVE
Nitrite, UA: POSITIVE — AB
PH UA: 5 (ref 5.0–7.5)
SPEC GRAV UA: 1.01 (ref 1.005–1.030)
Urobilinogen, Ur: 2 mg/dL — ABNORMAL HIGH (ref 0.2–1.0)

## 2017-02-18 LAB — MICROSCOPIC EXAMINATION

## 2017-02-18 MED ORDER — FLUCONAZOLE 150 MG PO TABS: 150.0000 mg | ORAL_TABLET | Freq: Once | ORAL | 0 refills | Status: AC

## 2017-02-18 MED ORDER — CIPROFLOXACIN HCL 500 MG PO TABS: 500.0000 mg | ORAL_TABLET | Freq: Two times a day (BID) | ORAL | 0 refills | Status: DC

## 2017-02-18 NOTE — Progress Notes (Signed)
  Chief Complaint  Patient presents with  . Dysuria    pt here today c/o dysuria and urinary urgency, hematuria and bladder spasms since yesterday.   HPI  Patient presents today for burning with urination and frequency for several days. Denies fever . No flank pain. No nausea, vomiting.   PMH: Smoking status noted ROS: Per HPI  Objective: BP 119/69   Pulse 74   Temp 98.7 F (37.1 C) (Oral)   Ht 5' (1.524 m)   Wt 143 lb (64.9 kg)   BMI 27.93 kg/m  Gen: NAD, alert, cooperative with exam CV: RRR, good S1/S2, no murmur Resp: CTABL, no wheezes, non-labored Abd: SNTND, BS present, no guarding or organomegaly Ext: No edema, warm Neuro: Alert and oriented, No gross deficits  Assessment and plan:  UTI symptoms - Plan: Urinalysis, Complete  Cystitis - Plan: ciprofloxacin (CIPRO) 500 MG tablet  Allergies as of 02/18/2017      Reactions   Dilaudid [hydromorphone Hcl] Anaphylaxis   Pt. says she tolerates morphine well.   Epinephrine Other (See Comments)   Difficulty breathing in the dentist office    Amoxicillin Swelling   FACIAL SWELLING   Cephalexin Hives      Medication List       Accurate as of 02/18/17 12:27 PM. Always use your most recent med list.          amphetamine-dextroamphetamine 20 MG tablet Commonly known as:  ADDERALL Take 20 mg by mouth daily. Takes in the afternoon   amphetamine-dextroamphetamine 20 MG 24 hr capsule Commonly known as:  ADDERALL XR Take 20 mg by mouth daily. At 6 am   ciprofloxacin 500 MG tablet Commonly known as:  CIPRO Take 1 tablet (500 mg total) by mouth 2 (two) times daily.   fluconazole 150 MG tablet Commonly known as:  DIFLUCAN Take 1 tablet (150 mg total) by mouth once. At onset of symptoms. Repeat at end of treatment   ibuprofen 800 MG tablet Commonly known as:  ADVIL,MOTRIN Take 1 tablet (800 mg total) by mouth 3 (three) times daily.   methocarbamol 750 MG tablet Commonly known as:  ROBAXIN-750 Take 1 tablet (750  mg total) by mouth 4 (four) times daily as needed for muscle spasms.   multivitamin tablet Take 2 tablets by mouth daily.        Orders Placed This Encounter  Procedures  . Urinalysis, Complete    Meds ordered this encounter  Medications  . ciprofloxacin (CIPRO) 500 MG tablet    Sig: Take 1 tablet (500 mg total) by mouth 2 (two) times daily.    Dispense:  10 tablet    Refill:  0  . fluconazole (DIFLUCAN) 150 MG tablet    Sig: Take 1 tablet (150 mg total) by mouth once. At onset of symptoms. Repeat at end of treatment    Dispense:  2 tablet    Refill:  0

## 2017-03-04 DIAGNOSIS — M47812 Spondylosis without myelopathy or radiculopathy, cervical region: Secondary | ICD-10-CM | POA: Diagnosis not present

## 2017-03-05 DIAGNOSIS — R0789 Other chest pain: Secondary | ICD-10-CM | POA: Diagnosis not present

## 2017-03-05 DIAGNOSIS — F419 Anxiety disorder, unspecified: Secondary | ICD-10-CM | POA: Diagnosis not present

## 2017-04-07 DIAGNOSIS — R030 Elevated blood-pressure reading, without diagnosis of hypertension: Secondary | ICD-10-CM | POA: Diagnosis not present

## 2017-04-07 DIAGNOSIS — Z6828 Body mass index (BMI) 28.0-28.9, adult: Secondary | ICD-10-CM | POA: Diagnosis not present

## 2017-04-07 DIAGNOSIS — M47812 Spondylosis without myelopathy or radiculopathy, cervical region: Secondary | ICD-10-CM | POA: Diagnosis not present

## 2018-01-21 DIAGNOSIS — F900 Attention-deficit hyperactivity disorder, predominantly inattentive type: Secondary | ICD-10-CM | POA: Diagnosis not present

## 2018-01-21 DIAGNOSIS — Z0000 Encounter for general adult medical examination without abnormal findings: Secondary | ICD-10-CM | POA: Diagnosis not present

## 2018-01-21 DIAGNOSIS — Z13220 Encounter for screening for lipoid disorders: Secondary | ICD-10-CM | POA: Diagnosis not present

## 2018-01-28 DIAGNOSIS — S199XXA Unspecified injury of neck, initial encounter: Secondary | ICD-10-CM | POA: Diagnosis not present

## 2018-01-28 DIAGNOSIS — F17200 Nicotine dependence, unspecified, uncomplicated: Secondary | ICD-10-CM | POA: Diagnosis not present

## 2018-01-28 DIAGNOSIS — M25522 Pain in left elbow: Secondary | ICD-10-CM | POA: Diagnosis not present

## 2018-01-28 DIAGNOSIS — S46912A Strain of unspecified muscle, fascia and tendon at shoulder and upper arm level, left arm, initial encounter: Secondary | ICD-10-CM | POA: Diagnosis not present

## 2018-01-28 DIAGNOSIS — M79671 Pain in right foot: Secondary | ICD-10-CM | POA: Diagnosis not present

## 2018-01-28 DIAGNOSIS — S59902A Unspecified injury of left elbow, initial encounter: Secondary | ICD-10-CM | POA: Diagnosis not present

## 2018-01-28 DIAGNOSIS — S0990XA Unspecified injury of head, initial encounter: Secondary | ICD-10-CM | POA: Diagnosis not present

## 2018-01-28 DIAGNOSIS — S91311A Laceration without foreign body, right foot, initial encounter: Secondary | ICD-10-CM | POA: Diagnosis not present

## 2018-01-28 DIAGNOSIS — S99921A Unspecified injury of right foot, initial encounter: Secondary | ICD-10-CM | POA: Diagnosis not present

## 2018-01-28 DIAGNOSIS — S0083XA Contusion of other part of head, initial encounter: Secondary | ICD-10-CM | POA: Diagnosis not present

## 2018-02-03 DIAGNOSIS — L601 Onycholysis: Secondary | ICD-10-CM | POA: Diagnosis not present

## 2018-02-03 DIAGNOSIS — L03019 Cellulitis of unspecified finger: Secondary | ICD-10-CM | POA: Diagnosis not present

## 2018-02-04 ENCOUNTER — Other Ambulatory Visit: Payer: Self-pay | Admitting: Physician Assistant

## 2018-02-04 DIAGNOSIS — Z1231 Encounter for screening mammogram for malignant neoplasm of breast: Secondary | ICD-10-CM

## 2018-03-09 ENCOUNTER — Ambulatory Visit
Admission: RE | Admit: 2018-03-09 | Discharge: 2018-03-09 | Disposition: A | Discharge status: Home / Self Care | Payer: BLUE CROSS/BLUE SHIELD | Source: Ambulatory Visit | Attending: Physician Assistant | Admitting: Physician Assistant

## 2018-03-09 DIAGNOSIS — Z1231 Encounter for screening mammogram for malignant neoplasm of breast: Secondary | ICD-10-CM

## 2018-03-09 DIAGNOSIS — M25522 Pain in left elbow: Secondary | ICD-10-CM | POA: Diagnosis not present

## 2018-03-10 ENCOUNTER — Other Ambulatory Visit: Payer: Self-pay | Admitting: Physician Assistant

## 2018-03-10 DIAGNOSIS — R928 Other abnormal and inconclusive findings on diagnostic imaging of breast: Secondary | ICD-10-CM

## 2018-03-15 ENCOUNTER — Ambulatory Visit
Admission: RE | Admit: 2018-03-15 | Discharge: 2018-03-15 | Disposition: A | Discharge status: Home / Self Care | Payer: BLUE CROSS/BLUE SHIELD | Source: Ambulatory Visit | Attending: Physician Assistant | Admitting: Physician Assistant

## 2018-03-15 ENCOUNTER — Ambulatory Visit: Payer: Self-pay

## 2018-03-15 DIAGNOSIS — R928 Other abnormal and inconclusive findings on diagnostic imaging of breast: Secondary | ICD-10-CM

## 2018-03-15 DIAGNOSIS — R922 Inconclusive mammogram: Secondary | ICD-10-CM | POA: Diagnosis not present

## 2018-03-17 DIAGNOSIS — M25522 Pain in left elbow: Secondary | ICD-10-CM | POA: Diagnosis not present

## 2018-03-17 DIAGNOSIS — M25622 Stiffness of left elbow, not elsewhere classified: Secondary | ICD-10-CM | POA: Diagnosis not present

## 2018-03-25 DIAGNOSIS — M25522 Pain in left elbow: Secondary | ICD-10-CM | POA: Diagnosis not present

## 2018-03-25 DIAGNOSIS — M25622 Stiffness of left elbow, not elsewhere classified: Secondary | ICD-10-CM | POA: Diagnosis not present

## 2018-07-23 DIAGNOSIS — F900 Attention-deficit hyperactivity disorder, predominantly inattentive type: Secondary | ICD-10-CM | POA: Diagnosis not present

## 2018-10-05 DIAGNOSIS — R300 Dysuria: Secondary | ICD-10-CM | POA: Diagnosis not present

## 2021-01-29 DIAGNOSIS — I10 Essential (primary) hypertension: Secondary | ICD-10-CM | POA: Diagnosis not present

## 2021-01-29 DIAGNOSIS — R0689 Other abnormalities of breathing: Secondary | ICD-10-CM | POA: Diagnosis not present

## 2021-01-29 DIAGNOSIS — R064 Hyperventilation: Secondary | ICD-10-CM | POA: Diagnosis not present

## 2021-04-30 ENCOUNTER — Emergency Department (HOSPITAL_COMMUNITY)
Admission: EM | Admit: 2021-04-30 | Discharge: 2021-04-30 | Disposition: A | Discharge status: Home / Self Care | Payer: Managed Care, Other (non HMO) | Attending: Emergency Medicine | Admitting: Emergency Medicine

## 2021-04-30 ENCOUNTER — Emergency Department (HOSPITAL_COMMUNITY): Payer: Managed Care, Other (non HMO)

## 2021-04-30 ENCOUNTER — Encounter (HOSPITAL_COMMUNITY): Payer: Self-pay | Admitting: *Deleted

## 2021-04-30 DIAGNOSIS — Z8616 Personal history of COVID-19: Secondary | ICD-10-CM | POA: Diagnosis not present

## 2021-04-30 DIAGNOSIS — J020 Streptococcal pharyngitis: Secondary | ICD-10-CM | POA: Diagnosis not present

## 2021-04-30 DIAGNOSIS — T360X5A Adverse effect of penicillins, initial encounter: Secondary | ICD-10-CM | POA: Insufficient documentation

## 2021-04-30 DIAGNOSIS — T361X5A Adverse effect of cephalosporins and other beta-lactam antibiotics, initial encounter: Secondary | ICD-10-CM | POA: Diagnosis not present

## 2021-04-30 DIAGNOSIS — M5442 Lumbago with sciatica, left side: Secondary | ICD-10-CM

## 2021-04-30 DIAGNOSIS — M545 Low back pain, unspecified: Secondary | ICD-10-CM | POA: Diagnosis not present

## 2021-04-30 DIAGNOSIS — J029 Acute pharyngitis, unspecified: Secondary | ICD-10-CM | POA: Diagnosis present

## 2021-04-30 LAB — GROUP A STREP BY PCR: Group A Strep by PCR: DETECTED — AB

## 2021-04-30 MED ORDER — KETOROLAC TROMETHAMINE 30 MG/ML IJ SOLN
30.0000 mg | Freq: Once | INTRAMUSCULAR | Status: AC
  Administered 2021-04-30: 30 mg via INTRAMUSCULAR
  Filled 2021-04-30: qty 1

## 2021-04-30 MED ORDER — PREDNISONE 10 MG PO TABS: 20.0000 mg | ORAL_TABLET | Freq: Every day | ORAL | 0 refills | Status: DC

## 2021-04-30 MED ORDER — AZITHROMYCIN 250 MG PO TABS: 250.0000 mg | ORAL_TABLET | Freq: Every day | ORAL | 0 refills | Status: DC

## 2021-04-30 MED ORDER — LIDOCAINE 5 % EX PTCH
1.0000 | MEDICATED_PATCH | CUTANEOUS | Status: DC
  Administered 2021-04-30: 1 via TRANSDERMAL
  Filled 2021-04-30: qty 1

## 2021-04-30 NOTE — ED Provider Notes (Signed)
[facility] EMERGENCY DEPARTMENT Provider Note   CSN: [id number] Arrival date & time: 04/30/21  1425     History Chief Complaint  Patient presents with   Back Pain    Courtney Parsons is a 45 y.o. female.  HPI  Patient presents with low back pain.  Started acutely yesterday while she was standing, she denies any trauma or mechanism.  States that the pain is constant, but is worse when she walks.  She is sharp shooting pain that moves down to the posterior side of her knee.  She has had fevers and chills, but this has been ongoing since she had COVID in early July.  No previous spinal surgeries, no IV drug use, no urinary retention, no history of malignancy, no saddle anesthesia.  Patient is also come concerned about a sore throat.  This started last night, she had a history of thrush and was taking Magic mouthwash with relief.  States this feels different.   Past Medical History:  Diagnosis Date   ADD (attention deficit disorder)    Ovarian cyst    h/o    Patient Active Problem List   Diagnosis Date Noted   HNP (herniated nucleus pulposus), cervical 04/28/2014    Past Surgical History:  Procedure Laterality Date   ABDOMINAL HYSTERECTOMY     ANTERIOR CERVICAL DECOMP/DISCECTOMY FUSION N/A 04/28/2014   Procedure: ANTERIOR CERVICAL DECOMPRESSION/DISCECTOMY FUSION 1 LEVEL Cervical six/seven;  Surgeon: Carmela Hurt, MD;  Location: [facility];  Service: Neurosurgery;  Laterality: N/A;   CESAREAN SECTION     LAPAROSCOPIC OVARIAN CYSTECTOMY Right 01/16/2015   Procedure: LAPAROSCOPIC OVARIAN CYSTECTOMY / EXTENSIVE ABDOMINAL LYSIS OF ADHESIONS;  Surgeon: Ok Edwards, MD;  Location: [facility];  Service: Gynecology;  Laterality: Right;   LAPAROSCOPIC UNILATERAL SALPINGECTOMY Right 01/16/2015   Procedure: LAPAROSCOPIC UNILATERAL SALPINGECTOMY;  Surgeon: Ok Edwards, MD;  Location: [facility];  Service: Gynecology;  Laterality: Right;   PELVIC LAPAROSCOPY       OB History      Gravida  1   Para  1   Term  1   Preterm      AB      Living  1      SAB      IAB      Ectopic      Multiple      Live Births              Family History  Problem Relation Age of Onset   Heart disease Mother    Diabetes Mother    Hypertension Mother    Diabetes Maternal Grandmother    Heart disease Maternal Grandfather    Heart attack Maternal Grandfather     Social History   Tobacco Use   Smoking status: Never   Smokeless tobacco: Never  Substance Use Topics   Alcohol use: No   Drug use: No    Home Medications Prior to Admission medications   Medication Sig Start Date End Date Taking? Authorizing Provider  amphetamine-dextroamphetamine (ADDERALL XR) 20 MG 24 hr capsule Take 20 mg by mouth daily. At 6 am    [provider]  amphetamine-dextroamphetamine (ADDERALL) 20 MG tablet Take 20 mg by mouth daily. Takes in the afternoon    [provider]  ciprofloxacin (CIPRO) 500 MG tablet Take 1 tablet (500 mg total) by mouth 2 (two) times daily. 02/18/17   Mechele Claude, MD  ibuprofen (ADVIL,MOTRIN) 800 MG tablet Take 1 tablet (800 mg  total) by mouth 3 (three) times daily. 06/14/15   Rasch, Victorino Dike I, NP  methocarbamol (ROBAXIN-750) 750 MG tablet Take 1 tablet (750 mg total) by mouth 4 (four) times daily as needed for muscle spasms. 12/25/16   Elenora Gamma, MD  Multiple Vitamin (MULTIVITAMIN) tablet Take 2 tablets by mouth daily.    [provider]    Allergies    Dilaudid [hydromorphone hcl], Epinephrine, Amoxicillin, and Cephalexin  Review of Systems   Review of Systems  Physical Exam Updated Vital Signs BP (!) 133/96 (BP Location: Right Arm)   Pulse 68   Temp 98 F (36.7 C) (Oral)   Resp 18   SpO2 95%   Physical Exam Vitals and nursing note reviewed. Exam conducted with a chaperone present.  Constitutional:      General: She is not in acute distress.    Appearance: Normal appearance.  HENT:     Head:  Normocephalic and atraumatic.     Mouth/Throat:     Pharynx: Posterior oropharyngeal erythema present. No oropharyngeal exudate.  Eyes:     General: No scleral icterus.    Extraocular Movements: Extraocular movements intact.     Pupils: Pupils are equal, round, and reactive to light.  Musculoskeletal:     Comments: No bony midline tenderness.  Skin:    Coloration: Skin is not jaundiced.  Neurological:     Mental Status: She is alert. Mental status is at baseline.     Coordination: Coordination normal.     Comments: Cranial nerves III through XII grossly intact, grips noticeable bilaterally.  Patient is able to ambulate without difficulties.  Lower extremity strength 5/5.   ED Results / Procedures / Treatments   Labs (all labs ordered are listed, but only abnormal results are displayed) Labs Reviewed - No data to display  EKG None  Radiology No results found.  Procedures Procedures   Medications Ordered in ED Medications - No data to display  ED Course  I have reviewed the triage vital signs and the nursing notes.  Pertinent labs & imaging results that were available during my care of the patient were reviewed by me and considered in my medical decision making (see chart for details).    MDM Rules/Calculators/A&P                           Patient tested positive for strep, she is pen allergic and Keflex allergic with anaphylaxis.  We will treat with azithromycin as recommended by up-to-date.  Discussed radiographic findings with the patient.  If advised her to follow-up with orthopedic surgery and given her short course of prednisone suspect her back pain is likely due to nerve distribution.  Return precautions were discussed, I doubt cauda equina or spinal abscess given lack of systemic symptoms and no trauma or red flag symptoms for cauda equina.  Vitals are stable, she is able to ambulate, she is appropriate for discharge at this time. Final Clinical Impression(s) /  ED Diagnoses Final diagnoses:  None    Rx / DC Orders ED Discharge Orders     None        Theron Arista, Cordelia Poche 04/30/21 1832    Gerhard Munch, MD 04/30/21 2325

## 2021-04-30 NOTE — ED Provider Notes (Signed)
Emergency Medicine Provider Triage Evaluation Note  Courtney Parsons , a 45 y.o. female  was evaluated in triage.  Pt complains of back pain that radiates down her left leg.  Started 2 days ago, worsened by movement.  No history of back surgeries, no IV drug use, no recent traumas.  She is also complaining of sore throat, recent history of COVID on July 5.  Review of Systems  Positive: Back pain, sore throat Negative: Trauma, injury  Physical Exam  BP (!) 133/96 (BP Location: Right Arm)   Pulse 68   Temp 98 F (36.7 C) (Oral)   Resp 18   SpO2 95%  Gen:   Awake, no distress   Resp:  Normal effort  MSK:   Moves extremities without difficulty  Other:    Medical Decision Making  Medically screening exam initiated at 3:03 PM.  Appropriate orders placed.  Courtney Parsons was informed that the remainder of the evaluation will be completed by another provider, this initial triage assessment does not replace that evaluation, and the importance of remaining in the ED until their evaluation is complete.     Theron Arista, PA-C 04/30/21 1504    Jacalyn Lefevre, MD 04/30/21 1505

## 2021-04-30 NOTE — ED Triage Notes (Signed)
Pain in back and left  hip, also c/o sore throat for 3 days

## 2021-04-30 NOTE — Discharge Instructions (Addendum)
Take 40 mg of prednisone x 5 days. Take 20 mg in the morning and 20 mg in the evening.  Take 250 mg Azithromycin x 5 days for strep throat.  Follow up with PCP in one week if no improvement

## 2023-09-29 ENCOUNTER — Other Ambulatory Visit (HOSPITAL_BASED_OUTPATIENT_CLINIC_OR_DEPARTMENT_OTHER): Payer: Self-pay

## 2023-09-29 MED ORDER — WEGOVY 0.25 MG/0.5ML ~~LOC~~ SOAJ
0.2500 mg | SUBCUTANEOUS | 0 refills | Status: DC
  Filled 2023-09-29: qty 2, 28d supply, fill #0

## 2023-10-01 ENCOUNTER — Other Ambulatory Visit (HOSPITAL_BASED_OUTPATIENT_CLINIC_OR_DEPARTMENT_OTHER): Payer: Self-pay

## 2023-10-28 ENCOUNTER — Other Ambulatory Visit (HOSPITAL_BASED_OUTPATIENT_CLINIC_OR_DEPARTMENT_OTHER): Payer: Self-pay

## 2023-10-28 MED ORDER — WEGOVY 0.5 MG/0.5ML ~~LOC~~ SOAJ
0.5000 mg | SUBCUTANEOUS | 0 refills | Status: DC
  Filled 2023-10-28: qty 2, 28d supply, fill #0

## 2023-10-29 ENCOUNTER — Other Ambulatory Visit (HOSPITAL_BASED_OUTPATIENT_CLINIC_OR_DEPARTMENT_OTHER): Payer: Self-pay

## 2024-06-12 ENCOUNTER — Observation Stay (HOSPITAL_COMMUNITY)
Admission: EM | Admit: 2024-06-12 | Discharge: 2024-06-13 | Disposition: A | Discharge status: Home / Self Care | Payer: PRIVATE HEALTH INSURANCE | Attending: Family Medicine | Admitting: Family Medicine

## 2024-06-12 ENCOUNTER — Other Ambulatory Visit: Payer: Self-pay

## 2024-06-12 ENCOUNTER — Emergency Department (HOSPITAL_COMMUNITY): Payer: PRIVATE HEALTH INSURANCE

## 2024-06-12 ENCOUNTER — Encounter (HOSPITAL_COMMUNITY): Payer: Self-pay | Admitting: Family Medicine

## 2024-06-12 DIAGNOSIS — R1011 Right upper quadrant pain: Secondary | ICD-10-CM | POA: Diagnosis not present

## 2024-06-12 DIAGNOSIS — R112 Nausea with vomiting, unspecified: Secondary | ICD-10-CM | POA: Diagnosis not present

## 2024-06-12 DIAGNOSIS — R109 Unspecified abdominal pain: Secondary | ICD-10-CM | POA: Diagnosis present

## 2024-06-12 DIAGNOSIS — R42 Dizziness and giddiness: Secondary | ICD-10-CM | POA: Diagnosis not present

## 2024-06-12 LAB — CBC WITH DIFFERENTIAL/PLATELET
Abs Immature Granulocytes: 0.02 K/uL (ref 0.00–0.07)
Basophils Absolute: 0.1 K/uL (ref 0.0–0.1)
Basophils Relative: 1 %
Eosinophils Absolute: 0.5 K/uL (ref 0.0–0.5)
Eosinophils Relative: 5 %
HCT: 40.8 % (ref 36.0–46.0)
Hemoglobin: 13.7 g/dL (ref 12.0–15.0)
Immature Granulocytes: 0 %
Lymphocytes Relative: 23 %
Lymphs Abs: 2.2 K/uL (ref 0.7–4.0)
MCH: 30.1 pg (ref 26.0–34.0)
MCHC: 33.6 g/dL (ref 30.0–36.0)
MCV: 89.7 fL (ref 80.0–100.0)
Monocytes Absolute: 0.5 K/uL (ref 0.1–1.0)
Monocytes Relative: 5 %
Neutro Abs: 6.2 K/uL (ref 1.7–7.7)
Neutrophils Relative %: 66 %
Platelets: 242 K/uL (ref 150–400)
RBC: 4.55 MIL/uL (ref 3.87–5.11)
RDW: 12.5 % (ref 11.5–15.5)
WBC: 9.4 K/uL (ref 4.0–10.5)
nRBC: 0 % (ref 0.0–0.2)

## 2024-06-12 LAB — CBG MONITORING, ED: Glucose-Capillary: 108 mg/dL — ABNORMAL HIGH (ref 70–99)

## 2024-06-12 LAB — HEPATIC FUNCTION PANEL
ALT: 14 U/L (ref 0–44)
AST: 15 U/L (ref 15–41)
Albumin: 3.2 g/dL — ABNORMAL LOW (ref 3.5–5.0)
Alkaline Phosphatase: 35 U/L — ABNORMAL LOW (ref 38–126)
Bilirubin, Direct: <0.1 mg/dL (ref 0.0–0.2)
Total Bilirubin: 0.4 mg/dL (ref 0.0–1.2)
Total Protein: 5.8 g/dL — ABNORMAL LOW (ref 6.5–8.1)

## 2024-06-12 LAB — BASIC METABOLIC PANEL WITH GFR
Anion gap: 12 (ref 5–15)
BUN: 11 mg/dL (ref 6–20)
CO2: 19 mmol/L — ABNORMAL LOW (ref 22–32)
Calcium: 9 mg/dL (ref 8.9–10.3)
Chloride: 105 mmol/L (ref 98–111)
Creatinine, Ser: 0.89 mg/dL (ref 0.44–1.00)
GFR, Estimated: >60 mL/min (ref >60)
Glucose, Bld: 136 mg/dL — ABNORMAL HIGH (ref 70–99)
Potassium: 4.6 mmol/L (ref 3.5–5.1)
Sodium: 136 mmol/L (ref 135–145)

## 2024-06-12 MED ORDER — MIDAZOLAM HCL 2 MG/2ML IJ SOLN
2.0000 mg | Freq: Four times a day (QID) | INTRAMUSCULAR | Status: DC | PRN
  Administered 2024-06-12: 2 mg via INTRAVENOUS
  Filled 2024-06-12: qty 2

## 2024-06-12 MED ORDER — ACETAMINOPHEN 650 MG RE SUPP: 650.0000 mg | Freq: Four times a day (QID) | RECTAL | Status: DC | PRN

## 2024-06-12 MED ORDER — LACTATED RINGERS IV SOLN
INTRAVENOUS | Status: AC
Start: 2024-06-12 — End: 2024-06-13

## 2024-06-12 MED ORDER — ONDANSETRON HCL 4 MG PO TABS: 4.0000 mg | ORAL_TABLET | Freq: Four times a day (QID) | ORAL | Status: DC | PRN

## 2024-06-12 MED ORDER — ONDANSETRON HCL 4 MG/2ML IJ SOLN: 4.0000 mg | Freq: Four times a day (QID) | INTRAMUSCULAR | Status: DC | PRN

## 2024-06-12 MED ORDER — SODIUM CHLORIDE 0.9% FLUSH
3.0000 mL | Freq: Two times a day (BID) | INTRAVENOUS | Status: DC
  Administered 2024-06-12 – 2024-06-13 (×3): 3 mL via INTRAVENOUS

## 2024-06-12 MED ORDER — MECLIZINE HCL 12.5 MG PO TABS
25.0000 mg | ORAL_TABLET | Freq: Three times a day (TID) | ORAL | Status: DC | PRN
  Administered 2024-06-13: 25 mg via ORAL
  Filled 2024-06-12: qty 2

## 2024-06-12 MED ORDER — ONDANSETRON HCL 4 MG/2ML IJ SOLN
4.0000 mg | Freq: Once | INTRAMUSCULAR | Status: AC
  Administered 2024-06-12: 4 mg via INTRAVENOUS
  Filled 2024-06-12: qty 2

## 2024-06-12 MED ORDER — SODIUM CHLORIDE 0.9 % IV BOLUS
500.0000 mL | Freq: Once | INTRAVENOUS | Status: AC
  Administered 2024-06-12: 500 mL via INTRAVENOUS

## 2024-06-12 MED ORDER — ACETAMINOPHEN 325 MG PO TABS: 650.0000 mg | ORAL_TABLET | Freq: Four times a day (QID) | ORAL | Status: DC | PRN

## 2024-06-12 MED ORDER — LORAZEPAM 2 MG/ML IJ SOLN
1.0000 mg | Freq: Once | INTRAMUSCULAR | Status: AC
  Administered 2024-06-12: 1 mg via INTRAVENOUS
  Filled 2024-06-12: qty 1

## 2024-06-12 MED ORDER — MECLIZINE HCL 12.5 MG PO TABS
25.0000 mg | ORAL_TABLET | Freq: Once | ORAL | Status: AC
  Administered 2024-06-12: 25 mg via ORAL
  Filled 2024-06-12: qty 2

## 2024-06-12 MED ORDER — ENOXAPARIN SODIUM 40 MG/0.4ML IJ SOSY
40.0000 mg | PREFILLED_SYRINGE | INTRAMUSCULAR | Status: DC
  Filled 2024-06-12: qty 0.4

## 2024-06-12 NOTE — ED Triage Notes (Signed)
 Patient arrived from home with [facility], patient called out for dizziness and vomiting. Patient reports it just started this morning upon waking, she reports decreasing her dosage on wegovy  in the past week. Patient received 300ml of normal saline in route by EMS.

## 2024-06-12 NOTE — ED Provider Notes (Signed)
 [facility] DE[facility]MENT AT [facility] Provider Note   CSN: [id number] Arrival date & time: 06/12/24  9264     Patient presents with: Dizziness   Courtney Parsons is a 48 y.o. female.  He is brought in by ambulance for acute onset of dizziness when she woke up this morning.  Afraid to open her eyes or move her head at all as she is spinning so badly.  Associated with nausea and vomiting.  She had 1 prior episode years ago.  No headache or ear ringing.  Has tried nothing for her symptoms.  Recently tapering Wegovy .   The history is provided by the patient and the EMS personnel.  Dizziness Quality:  Vertigo Severity:  Severe Onset quality:  Sudden Duration:  1 hour Timing:  Constant Progression:  Unchanged Chronicity:  Recurrent Relieved by:  Nothing Worsened by:  Movement, eye movement and lying down Ineffective treatments:  Being still Associated symptoms: nausea and vomiting   Associated symptoms: no chest pain, no headaches, no hearing loss and no shortness of breath        Prior to Admission medications   Medication Sig Start Date End Date Taking? Authorizing Provider  amphetamine -dextroamphetamine (ADDERALL XR) 20 MG 24 hr capsule Take 20 mg by mouth daily. At 6 am    [provider]  amphetamine -dextroamphetamine (ADDERALL) 20 MG tablet Take 20 mg by mouth daily. Takes in the afternoon    [provider]  azithromycin  (ZITHROMAX ) 250 MG tablet Take 1 tablet (250 mg total) by mouth daily. Take first 2 tablets together, then 1 every day until finished. 04/30/21   Emelia Sluder, PA-C  ciprofloxacin  (CIPRO ) 500 MG tablet Take 1 tablet (500 mg total) by mouth 2 (two) times daily. 02/18/17   Zollie Lowers, MD  ibuprofen  (ADVIL ,MOTRIN ) 800 MG tablet Take 1 tablet (800 mg total) by mouth 3 (three) times daily. 06/14/15   Rasch, Delon I, NP  methocarbamol  (ROBAXIN -750) 750 MG tablet Take 1 tablet (750 mg total) by mouth 4 (four) times daily as  needed for muscle spasms. 12/25/16   Harl Jayson CROME, MD  Multiple Vitamin (MULTIVITAMIN) tablet Take 2 tablets by mouth daily.    [provider]  predniSONE  (DELTASONE ) 10 MG tablet Take 2 tablets (20 mg total) by mouth daily. 04/30/21   Emelia Sluder, PA-C  Semaglutide -Weight Management (WEGOVY ) 0.25 MG/0.5ML SOAJ Inject 0.25 mg into the skin every 7 (seven) days. 09/29/23     Semaglutide -Weight Management (WEGOVY ) 0.5 MG/0.5ML SOAJ Inject 0.5 mg into the skin once a week. 10/28/23       Allergies: Dilaudid [hydromorphone hcl], Epinephrine , Amoxicillin , and Cephalexin    Review of Systems  Constitutional:  Negative for fever.  HENT:  Negative for hearing loss.   Eyes:  Negative for pain.  Respiratory:  Negative for shortness of breath.   Cardiovascular:  Negative for chest pain.  Gastrointestinal:  Positive for nausea and vomiting.  Neurological:  Positive for dizziness. Negative for headaches.    Updated Vital Signs BP 126/74   Pulse 61   Temp (!) 96.2 F (35.7 C) (Rectal)   Resp 16   Ht 5' (1.524 m)   Wt 56.2 kg   SpO2 100%   BMI 24.22 kg/m   Physical Exam Vitals and nursing note reviewed.  Constitutional:      General: She is not in acute distress.    Appearance: Normal appearance. She is well-developed.  HENT:     Head: Normocephalic and atraumatic.  Right Ear: Tympanic membrane, ear canal and external ear normal.     Left Ear: Tympanic membrane, ear canal and external ear normal.  Eyes:     Extraocular Movements: Extraocular movements intact.     Conjunctiva/sclera: Conjunctivae normal.     Pupils: Pupils are equal, round, and reactive to light.     Comments: She has a few beats of horizontal nystagmus with leftward gaze  Cardiovascular:     Rate and Rhythm: Normal rate and regular rhythm.     Heart sounds: No murmur heard. Pulmonary:     Effort: Pulmonary effort is normal. No respiratory distress.     Breath sounds: Normal breath sounds. No stridor.  No wheezing.  Abdominal:     Palpations: Abdomen is soft.     Tenderness: There is no abdominal tenderness. There is no guarding or rebound.  Musculoskeletal:        General: No deformity.     Cervical back: Neck supple.  Skin:    General: Skin is warm and dry.  Neurological:     General: No focal deficit present.     Mental Status: She is alert.     GCS: GCS eye subscore is 4. GCS verbal subscore is 5. GCS motor subscore is 6.     (all labs ordered are listed, but only abnormal results are displayed) Labs Reviewed  BASIC METABOLIC PANEL WITH GFR - Abnormal; Notable for the following components:      Result Value   CO2 19 (*)    Glucose, Bld 136 (*)    All other components within normal limits  CBG MONITORING, ED - Abnormal; Notable for the following components:   Glucose-Capillary 108 (*)    All other components within normal limits  CBC WITH DIFFERENTIAL/PLATELET  HIV ANTIBODY (ROUTINE TESTING W REFLEX)  HEPATIC FUNCTION PANEL  BASIC METABOLIC PANEL WITH GFR    EKG: EKG Interpretation Date/Time:  Sunday June 12 2024 07:51:29 EDT Ventricular Rate:  58 PR Interval:  158 QRS Duration:  131 QT Interval:  468 QTC Calculation: 460 R Axis:   55  Text Interpretation: Sinus rhythm Nonspecific intraventricular conduction delay Minimal ST depression, anterolateral leads Lead(s) aVL were not used for morphology analysis Baseline wander in lead(s) V4 V5 No significant change since prior 4/16 Confirmed by Towana Sharper [phone]) on 06/12/2024 7:52:43 AM  Radiology: MR BRAIN WO CONTRAST Result Date: 06/12/2024 EXAM: MRI BRAIN WITHOUT CONTRAST 06/12/2024 12:18:52 PM TECHNIQUE: Multiplanar multisequence MRI of the head/brain was performed without the administration of intravenous contrast. COMPARISON: CT maxillofacial with contrast 08/15/2016. CT head without contrast 01/28/2018 at [facility]. Images are not available. CLINICAL HISTORY: Neuro deficit, acute, stroke suspected;  dizziness. Patient arrived from home with [facility], patient called out for dizziness and vomiting. Patient reports it just started this morning upon waking, she reports decreasing her dosage on Wegovy  in the past week. Patient received 300ml of normal saline en route by EMS. FINDINGS: BRAIN AND VENTRICLES: No acute infarct. No intracranial hemorrhage. No mass. No midline shift. No hydrocephalus. The sella is unremarkable. Normal flow voids. ORBITS: No acute abnormality. SINUSES AND MASTOIDS: No acute abnormality. BONES AND SOFT TISSUES: Normal marrow signal. No acute soft tissue abnormality. IMPRESSION: 1. No acute intracranial abnormality. Electronically signed by: Lonni Necessary MD 06/12/2024 01:01 PM EDT RP Workstation: [id number]     Procedures   Medications Ordered in the ED  LORazepam  (ATIVAN ) injection 1 mg (has no administration in time range)  ondansetron  (ZOFRAN ) injection 4  mg (has no administration in time range)  sodium chloride  0.9 % bolus 500 mL (has no administration in time range)    Clinical Course as of 06/12/24 1718  Sun Jun 12, 2024  0922 Patient's dizziness has improved somewhat.  She is able to lay back and open her eyes and move her head around a little bit. [MB]  1103 Patient states she is feeling better.  Will ambulatory trial her. [MB]  1137 Patient was not able to ambulate as became acutely dizzy nauseous diaphoretic.  Have put her in for an MRI. [MB]  1409 MRI is negative.  Patient still very symptomatic and unable to ambulate safely in the department.  She is agreeable to admission.  Discussed with Dr. Rhesa [facility] hospitalist will evaluate her for admission [MB]    Clinical Course User Index [MB] Towana Ozell BROCKS, MD                                 Medical Decision Making Amount and/or Complexity of Data Reviewed Labs: ordered. Radiology: ordered.  Risk OTC drugs. Prescription drug management. Decision regarding hospitalization.   This  patient complains of acute vertigo dizziness nausea vomiting; this involves an extensive number of treatment Options and is a complaint that carries with it a high risk of complications and morbidity. The differential includes vertigo, stroke, infection  I ordered, reviewed and interpreted labs, which included CBC normal chemistries with low bicarb elevated glucose I ordered medication IV Ativan  and fluids IV nausea medication, meclizine  and reviewed PMP when indicated. I ordered imaging studies which included MRI brain and I independently    visualized and interpreted imaging which showed no acute findings Additional history obtained from patient's daughter Previous records obtained and reviewed in epic no recent admissions I consulted [facility] hospitalist Dr. Bryn and discussed lab and imaging findings and discussed disposition.  Cardiac monitoring reviewed, sinus rhythm Social determinants considered, no significant barriers Critical Interventions: None  After the interventions stated above, I reevaluated the patient and found patient to be feeling much better at rest but symptomatic with any type of movement Admission and further testing considered, patient is not safe to return home at this time as lives alone.  Will admit to the hospital for symptomatic treatment and further management.      Final diagnoses:  Vertigo    ED Discharge Orders     None          Towana Ozell BROCKS, MD 06/12/24 1720

## 2024-06-12 NOTE — H&P (Signed)
 History and Physical    Patient: Courtney Parsons FMW:[id number] DOB: 19-Mar-1976 DOA: 06/12/2024 DOS: the patient was seen and examined on 06/12/2024 PCP: Allan Rogue  Patient coming from: Home  Chief Complaint:  Chief Complaint  Patient presents with   Dizziness   HPI: Courtney Parsons is a 48 y.o. female with a history of obesity now with normal BMI on wegovy  who presented to the ED with severe dizziness associated with vomiting. She woke up at 6:30am with severe dizziness at rest, room spinning, severe nausea, room kept spinning despite closing her eyes. Had emesis, was unable to ambulate due to symptoms. She's had similar, though much less intense, symptoms one time years back that lasted a day and resolved. She received meclizine  without improvement in the ED, got 500cc IVF and ativan  which did help symptoms (down to moderate) but still has severe nausea and was unable to ambulate in ED.   She also reports significant weight loss since starting wegovy  in Jan 2025, though has abdominal pain in the RUQ that causes her to double over sometimes when she eats, also has constipation. No work up yet tried, though she halved her dose of wegovy  on the last injection (7 days ago). No other new medications, takes estradiol  for perimenopausal symptoms, calcium-vitamin D, no other meds. Family members have required cholecystectomy.   Review of Systems: As mentioned in the history of present illness. All other systems reviewed and are negative. Past Medical History:  Diagnosis Date   ADD (attention deficit disorder)    Ovarian cyst    h/o   Past Surgical History:  Procedure Laterality Date   ABDOMINAL HYSTERECTOMY     ANTERIOR CERVICAL DECOMP/DISCECTOMY FUSION N/A 04/28/2014   Procedure: ANTERIOR CERVICAL DECOMPRESSION/DISCECTOMY FUSION 1 LEVEL Cervical six/seven;  Surgeon: Rockey LITTIE Peru, MD;  Location: [facility];  Service: Neurosurgery;  Laterality: N/A;   CESAREAN SECTION      LAPAROSCOPIC OVARIAN CYSTECTOMY Right 01/16/2015   Procedure: LAPAROSCOPIC OVARIAN CYSTECTOMY / EXTENSIVE ABDOMINAL LYSIS OF ADHESIONS;  Surgeon: Curlee VEAR Guan, MD;  Location: [facility];  Service: Gynecology;  Laterality: Right;   LAPAROSCOPIC UNILATERAL SALPINGECTOMY Right 01/16/2015   Procedure: LAPAROSCOPIC UNILATERAL SALPINGECTOMY;  Surgeon: Curlee VEAR Guan, MD;  Location: [facility];  Service: Gynecology;  Laterality: Right;   PELVIC LAPAROSCOPY     Social History:  reports that she has never smoked. She has never used smokeless tobacco. She reports that she does not drink alcohol and does not use drugs.  Allergies  Allergen Reactions   Dilaudid [Hydromorphone Hcl] Anaphylaxis    Pt. says she tolerates morphine  well.   Epinephrine  Other (See Comments)    Difficulty breathing in the dentist office    Amoxicillin  Swelling    FACIAL SWELLING   Cephalexin Hives    Family History  Problem Relation Age of Onset   Heart disease Mother    Diabetes Mother    Hypertension Mother    Diabetes Maternal Grandmother    Heart disease Maternal Grandfather    Heart attack Maternal Grandfather     Prior to Admission medications   Medication Sig Start Date End Date Taking? Authorizing Provider  amphetamine -dextroamphetamine (ADDERALL XR) 20 MG 24 hr capsule Take 20 mg by mouth daily. At 6 am    [provider]  amphetamine -dextroamphetamine (ADDERALL) 20 MG tablet Take 20 mg by mouth daily. Takes in the afternoon    [provider]  azithromycin  (ZITHROMAX ) 250 MG tablet Take 1 tablet (250 mg  total) by mouth daily. Take first 2 tablets together, then 1 every day until finished. 04/30/21   Emelia Sluder, PA-C  ciprofloxacin  (CIPRO ) 500 MG tablet Take 1 tablet (500 mg total) by mouth 2 (two) times daily. 02/18/17   Zollie Lowers, MD  ibuprofen  (ADVIL ,MOTRIN ) 800 MG tablet Take 1 tablet (800 mg total) by mouth 3 (three) times daily. 06/14/15   Rasch, Delon I, NP  methocarbamol   (ROBAXIN -750) 750 MG tablet Take 1 tablet (750 mg total) by mouth 4 (four) times daily as needed for muscle spasms. 12/25/16   Harl Jayson CROME, MD  Multiple Vitamin (MULTIVITAMIN) tablet Take 2 tablets by mouth daily.    [provider]  predniSONE  (DELTASONE ) 10 MG tablet Take 2 tablets (20 mg total) by mouth daily. 04/30/21   Emelia Sluder, PA-C  Semaglutide -Weight Management (WEGOVY ) 0.25 MG/0.5ML SOAJ Inject 0.25 mg into the skin every 7 (seven) days. 09/29/23     Semaglutide -Weight Management (WEGOVY ) 0.5 MG/0.5ML SOAJ Inject 0.5 mg into the skin once a week. 10/28/23       Physical Exam: Vitals:   06/12/24 1045 06/12/24 1100 06/12/24 1115 06/12/24 1339  BP: 101/69 103/73 102/69 110/86  Pulse: 61 (!) 58 60   Resp: 12 12    Temp:      TempSrc:      SpO2: 100% 100% 99%   Weight:      Height:      Gen: No acute distress Pulm: Clear, nonlabored  CV: RRR, borderline bradycardia, normotensive GI: Soft, NT, ND, +BS. Negative Murphy's, no palpable stool in LLQ Neuro: Alert and oriented. No endpoint nystagmus, but sitting up in bed caused severe symptoms limiting the rest of exam. No new focal deficits. Ext: Warm, no deformities Skin: No rashes, lesions or ulcers on visualized skin   Data Reviewed: CBC, BMP remarkable only for mild metabolic acidosis (bicarbonate 19), anion gap 12, glucose 136. Not on SGLT2i. ECG NSR. MRI brain normal.   Assessment and Plan: Vertigo: Symptoms limited examination, though MRI brain was negative. Primarily suspect peripheral vertigo, most commonly BPPV. Component of orthostasis also suspected.  - Check orthostatics - Continue IVF x2 L and monitor acidosis in AM.  - Vestibular PT evaluation requested - Continue prn meclizine  if able to tolerate po, prn zofran , and prn BZD (versed  due to ativan  shortage).   Postprandial RUQ abdominal pain: Pt was obese, lost weight rapidly, in her 40's on estrogen.  - RUQ U/S - Check LFTs    Advance Care  Planning:   Code Status: Full Code  Consults: None  Family Communication: At bedside  Severity of Illness: The appropriate patient status for this patient is OBSERVATION. Observation status is judged to be reasonable and necessary in order to provide the required intensity of service to ensure the patient's safety. The patient's presenting symptoms, physical exam findings, and initial radiographic and laboratory data in the context of their medical condition is felt to place them at decreased risk for further clinical deterioration. Furthermore, it is anticipated that the patient will be medically stable for discharge from the hospital within 2 midnights of admission.   Author: Bernardino KATHEE Come, MD 06/12/2024 2:47 PM  For on call review [email or web address].

## 2024-06-12 NOTE — ED Notes (Signed)
 Attempted ambulation non-successful. Patient was able to ambulate to room door and became diaphoretic and nauseous as well as dizzy. MD made aware.

## 2024-06-12 NOTE — ED Notes (Signed)
 Report given to Renae, RN on accepting unit, no further questions at this time.

## 2024-06-12 NOTE — ED Notes (Signed)
 Pt was able to ambulate down the hallway and back to room but pt stated she was very dizzy and felt nauseous while ambulating; pt unsteady and needed something to hold on to to stay balanced while ambulating; MD made aware

## 2024-06-12 NOTE — Plan of Care (Signed)

## 2024-06-13 ENCOUNTER — Observation Stay (HOSPITAL_COMMUNITY): Payer: PRIVATE HEALTH INSURANCE

## 2024-06-13 DIAGNOSIS — R42 Dizziness and giddiness: Secondary | ICD-10-CM | POA: Diagnosis not present

## 2024-06-13 LAB — BASIC METABOLIC PANEL WITH GFR
Anion gap: 6 (ref 5–15)
BUN: 10 mg/dL (ref 6–20)
CO2: 23 mmol/L (ref 22–32)
Calcium: 8.1 mg/dL — ABNORMAL LOW (ref 8.9–10.3)
Chloride: 108 mmol/L (ref 98–111)
Creatinine, Ser: 0.75 mg/dL (ref 0.44–1.00)
GFR, Estimated: >60 mL/min (ref >60)
Glucose, Bld: 87 mg/dL (ref 70–99)
Potassium: 3.9 mmol/L (ref 3.5–5.1)
Sodium: 137 mmol/L (ref 135–145)

## 2024-06-13 LAB — HIV ANTIBODY (ROUTINE TESTING W REFLEX): HIV Screen 4th Generation wRfx: NONREACTIVE

## 2024-06-13 MED ORDER — LORAZEPAM 0.5 MG PO TABS: 0.5000 mg | ORAL_TABLET | Freq: Three times a day (TID) | ORAL | 0 refills | Status: AC | PRN

## 2024-06-13 MED ORDER — MECLIZINE HCL 25 MG PO TABS: 25.0000 mg | ORAL_TABLET | Freq: Three times a day (TID) | ORAL | 0 refills | Status: AC | PRN

## 2024-06-13 NOTE — Progress Notes (Signed)
 Transition of Care Department [facility]) has reviewed patient and no other TOC needs have been identified at this time. We will continue to monitor patient advancement through interdisciplinary progression rounds. If new patient transition needs arise, please place a TOC consult.   06/13/24 0947  TOC Brief Assessment  Insurance and Status Reviewed  Patient has primary care physician Yes  Home environment has been reviewed Lives alone.  Prior level of function: Works full-time.  Prior/Current Home Services No current home services  Social Drivers of Health Review SDOH reviewed no interventions necessary  Readmission risk has been reviewed Yes  Transition of care needs no transition of care needs at this time

## 2024-06-13 NOTE — Evaluation (Signed)
 Physical Therapy Evaluation Patient Details Name: Courtney Parsons MRN: [id number] DOB: March 07, 1976 Today's Date: 06/13/2024  History of Present Illness  Courtney Parsons is a 48 y.o. female with a history of obesity now with normal BMI on wegovy  who presented to the ED with severe dizziness associated with vomiting. She woke up at 6:30am with severe dizziness at rest, room spinning, severe nausea, room kept spinning despite closing her eyes. Had emesis, was unable to ambulate due to symptoms. She's had similar, though much less intense, symptoms one time years back that lasted a day and resolved. She received meclizine  without improvement in the ED, got 500cc IVF and ativan  which did help symptoms (down to moderate) but still has severe nausea and was unable to ambulate in ED.      She also reports significant weight loss since starting wegovy  in Jan 2025, though has abdominal pain in the RUQ that causes her to double over sometimes when she eats, also has constipation. No work up yet tried, though she halved her dose of wegovy  on the last injection (7 days ago). No other new medications, takes estradiol  for perimenopausal symptoms, calcium-vitamin D, no other meds. Family members have required cholecystectomy.   Clinical Impression  Patient negative for nystagmus or c/o dizziness/vertigo during right and left Dix-Hallpike maneuvers.  Patient c/o mild dizziness while sitting up at bedside. Patient demonstrates good return ambulating in room, hallways without loss of balance or need for an AD. Patient encouraged to follow up with her primary MD to request another vestibular assessment after being free of dizziness medication for at least 3 - 4 days. Plan:  Patient discharged from physical therapy to care of nursing for ambulation daily as tolerated for length of stay.       If plan is discharge home, recommend the following: Other (comment) (near baseline)   Can travel by private vehicle         Equipment Recommendations None recommended by PT  Recommendations for Other Services       Functional Status Assessment Patient has not had a recent decline in their functional status     Precautions / Restrictions Precautions Precautions: None Restrictions Weight Bearing Restrictions Per Provider Order: No      Mobility  Bed Mobility Overal bed mobility: Independent                  Transfers Overall transfer level: Independent                      Ambulation/Gait Ambulation/Gait assistance: Modified independent (Device/Increase time), Independent Gait Distance (Feet): 120 Feet Assistive device: None Gait Pattern/deviations: WFL(Within Functional Limits) Gait velocity: near normal     General Gait Details: grossly WFL with good return for ambulating in room, hallways without loss of balance or need for an AD  Stairs            Wheelchair Mobility     Tilt Bed    Modified Rankin (Stroke Patients Only)       Balance Overall balance assessment: Independent                                           Pertinent Vitals/Pain Pain Assessment Pain Assessment: Faces Faces Pain Scale: Hurts a little bit Pain Location: mild headache Pain Descriptors / Indicators: Headache Pain Intervention(s): Monitored during session  Home Living Family/patient expects to be discharged to:: Private residence Living Arrangements: Alone Available Help at Discharge: Family;Available PRN/intermittently Type of Home: House Home Access: Stairs to enter Entrance Stairs-Rails: None Entrance Stairs-Number of Steps: 2   Home Layout: One level Home Equipment: None      Prior Function Prior Level of Function : Independent/Modified Independent;Driving             Mobility Comments: community ambulation without AD, drives, shops ADLs Comments: Independent     Extremity/Trunk Assessment   Upper Extremity Assessment Upper Extremity  Assessment: Overall WFL for tasks assessed    Lower Extremity Assessment Lower Extremity Assessment: Overall WFL for tasks assessed    Cervical / Trunk Assessment Cervical / Trunk Assessment: Normal  Communication   Communication Communication: No apparent difficulties    Cognition Arousal: Alert Behavior During Therapy: WFL for tasks assessed/performed   PT - Cognitive impairments: No apparent impairments                         Following commands: Intact       Cueing Cueing Techniques: Verbal cues     General Comments      Exercises     Assessment/Plan    PT Assessment Patient does not need any further PT services  PT Problem List         PT Treatment Interventions      PT Goals (Current goals can be found in the Care Plan section)  Acute Rehab PT Goals Patient Stated Goal: return home PT Goal Formulation: With patient Time For Goal Achievement: 06/13/24 Potential to Achieve Goals: Good    Frequency       Co-evaluation               AM-PAC PT 6 Clicks Mobility  Outcome Measure Help needed turning from your back to your side while in a flat bed without using bedrails?: None Help needed moving from lying on your back to sitting on the side of a flat bed without using bedrails?: None Help needed moving to and from a bed to a chair (including a wheelchair)?: None Help needed standing up from a chair using your arms (e.g., wheelchair or bedside chair)?: None Help needed to walk in hospital room?: None Help needed climbing 3-5 steps with a railing? : None 6 Click Score: 24    End of Session   Activity Tolerance: Patient tolerated treatment well Patient left: in bed;with call bell/phone within reach Nurse Communication: Mobility status PT Visit Diagnosis: Unsteadiness on feet (R26.81);Other abnormalities of gait and mobility (R26.89);Dizziness and giddiness (R42)    Time: 9154-9090 PT Time Calculation (min) (ACUTE ONLY): 24  min   Charges:   PT Evaluation $PT Eval Moderate Complexity: 1 Mod PT Treatments $Therapeutic Activity: 23-37 mins PT General Charges $$ ACUTE PT VISIT: 1 Visit         12:37 PM, 06/13/24 Lynwood Music, MPT Physical Therapist with [facility] [phone] office [phone] mobile phone

## 2024-06-13 NOTE — Plan of Care (Signed)

## 2024-06-13 NOTE — Discharge Summary (Signed)
 Physician Discharge Summary   Patient: Courtney Parsons MRN: [id number] DOB: Mar 09, 1976  Admit date:     06/12/2024  Discharge date: 06/13/24  Discharge Physician: Bernardino KATHEE Come   PCP: Allan Rogue (Inactive)   Recommendations at discharge:  Follow up with PCP for management of abdominal pain. RUQ U/S.  Follow up with vestibular rehabilitation if symptoms persist.  Discharge Diagnoses: Principal Problem:   Vertigo Active Problems:   Abdominal pain   Intractable vomiting with nausea  Hospital Course: HPI: Courtney Parsons is a 48 y.o. female with a history of obesity now with normal BMI on wegovy  who presented to the ED with severe dizziness associated with vomiting. She woke up at 6:30am with severe dizziness at rest, room spinning, severe nausea, room kept spinning despite closing her eyes. Had emesis, was unable to ambulate due to symptoms. She's had similar, though much less intense, symptoms one time years back that lasted a day and resolved. She received meclizine  without improvement in the ED, got 500cc IVF and ativan  which did help symptoms (down to moderate) but still has severe nausea and was unable to ambulate in ED.    She also reports significant weight loss since starting wegovy  in Jan 2025, though has abdominal pain in the RUQ that causes her to double over sometimes when she eats, also has constipation. No work up yet tried, though she halved her dose of wegovy  on the last injection (7 days ago). No other new medications, takes estradiol  for perimenopausal symptoms, calcium-vitamin D, no other meds. Family members have required cholecystectomy.   Assessment and Plan: Vertigo: MRI brain negative, suspect peripheral vertigo, most commonly BPPV, improving with supportive care and medications.  - Continue meclizine  prn, can use lorazepam  po prn for refractory symptoms if needed.   Postprandial RUQ abdominal pain: Pt was obese, lost weight rapidly, in her 40's on estrogen.  Exam benign and tolerating some po. LFTs not elevated - RUQ U/S was performed and is pending at time of discharge.   Consultants: None Procedures performed: None  Disposition: Home Diet recommendation: As tolerated DISCHARGE MEDICATION: Allergies as of 06/13/2024       Reactions   Dilaudid [hydromorphone Hcl] Anaphylaxis   Pt. says she tolerates morphine  well.   Epinephrine  Other (See Comments)   Difficulty breathing in the dentist office    Sulfa Antibiotics Anaphylaxis   swelling   Amoxicillin  Swelling   FACIAL SWELLING   Cephalexin Hives        Medication List     TAKE these medications    cetirizine 10 MG tablet Commonly known as: ZYRTEC Take 10 mg by mouth daily.   estradiol  1 MG tablet Commonly known as: ESTRACE  Take 1 mg by mouth daily.   ibuprofen  800 MG tablet Commonly known as: ADVIL  Take 1 tablet (800 mg total) by mouth 3 (three) times daily.   LORazepam  0.5 MG tablet Commonly known as: Ativan  Take 1 tablet (0.5 mg total) by mouth every 8 (eight) hours as needed (refractory vertigo).   meclizine  25 MG tablet Commonly known as: ANTIVERT  Take 1 tablet (25 mg total) by mouth 3 (three) times daily as needed for dizziness.   multivitamin tablet Take 2 tablets by mouth daily.   semaglutide -weight management 1.7 MG/0.75ML Soaj SQ injection Commonly known as: WEGOVY  Inject 1.7 mg into the skin every 7 (seven) days.        Discharge Exam: Filed Weights   06/12/24 0804  Weight: 56.2 kg  BP (!) 105/52 (  BP Location: Left Arm)   Pulse (!) 57   Temp 98.4 F (36.9 C) (Oral)   Resp 19   Ht 5' (1.524 m)   Wt 56.2 kg   SpO2 99%   BMI 24.22 kg/m   No distress, calm, well-appearing. Ambulating freely in room Clear, nonlabored RRR Soft Alert, oriented, no end point nystagmus.  Condition at discharge: stable  The results of significant diagnostics from this hospitalization (including imaging, microbiology, ancillary and laboratory) are listed  below for reference.   Imaging Studies: MR BRAIN WO CONTRAST Result Date: 06/12/2024 EXAM: MRI BRAIN WITHOUT CONTRAST 06/12/2024 12:18:52 PM TECHNIQUE: Multiplanar multisequence MRI of the head/brain was performed without the administration of intravenous contrast. COMPARISON: CT maxillofacial with contrast 08/15/2016. CT head without contrast 01/28/2018 at [facility]. Images are not available. CLINICAL HISTORY: Neuro deficit, acute, stroke suspected; dizziness. Patient arrived from home with [facility], patient called out for dizziness and vomiting. Patient reports it just started this morning upon waking, she reports decreasing her dosage on Wegovy  in the past week. Patient received 300ml of normal saline en route by EMS. FINDINGS: BRAIN AND VENTRICLES: No acute infarct. No intracranial hemorrhage. No mass. No midline shift. No hydrocephalus. The sella is unremarkable. Normal flow voids. ORBITS: No acute abnormality. SINUSES AND MASTOIDS: No acute abnormality. BONES AND SOFT TISSUES: Normal marrow signal. No acute soft tissue abnormality. IMPRESSION: 1. No acute intracranial abnormality. Electronically signed by: Lonni Necessary MD 06/12/2024 01:01 PM EDT RP Workstation: [id number]    Microbiology: Results for orders placed or performed during the hospital encounter of 04/30/21  Group A Strep by PCR     Status: Abnormal   Collection Time: 04/30/21  3:33 PM   Specimen: Throat; Sterile Swab  Result Value Ref Range Status   Group A Strep by PCR DETECTED (A) NOT DETECTED Final    Comment: Performed at [facility], [address], [place], [place] [address]    Labs: CBC: Recent Labs  Lab 06/12/24 0803  WBC 9.4  NEUTROABS 6.2  HGB 13.7  HCT 40.8  MCV 89.7  PLT 242   Basic Metabolic Panel: Recent Labs  Lab 06/12/24 0803 06/13/24 0424  NA 136 137  K 4.6 3.9  CL 105 108  CO2 19* 23  GLUCOSE 136* 87  BUN 11 10  CREATININE 0.89 0.75  CALCIUM 9.0 8.1*   Liver Function  Tests: Recent Labs  Lab 06/12/24 1836  AST 15  ALT 14  ALKPHOS 35*  BILITOT 0.4  PROT 5.8*  ALBUMIN 3.2*   CBG: Recent Labs  Lab 06/12/24 0754  GLUCAP 108*    Discharge time spent: greater than 30 minutes.  Signed: Bernardino KATHEE Come, MD [facility] 06/13/2024
